# Patient Record
Sex: Female | Born: 1995 | Race: Black or African American | Hispanic: No | Marital: Single | Smoking: Never smoker
Health system: Southern US, Community
[De-identification: ages and names within clinical notes are randomized; demographics above are authoritative.]

## PROBLEM LIST (undated history)

## (undated) DIAGNOSIS — Z8719 Personal history of other diseases of the digestive system: Secondary | ICD-10-CM

## (undated) DIAGNOSIS — Z789 Other specified health status: Secondary | ICD-10-CM

## (undated) HISTORY — DX: Other specified health status: Z78.9

## (undated) HISTORY — PX: OTHER SURGICAL HISTORY: SHX169

---

## 2016-08-20 ENCOUNTER — Encounter (HOSPITAL_COMMUNITY): Payer: Self-pay | Admitting: Emergency Medicine

## 2016-08-20 ENCOUNTER — Ambulatory Visit (HOSPITAL_COMMUNITY)
Admission: EM | Admit: 2016-08-20 | Discharge: 2016-08-20 | Disposition: A | Discharge status: Home / Self Care | Payer: Managed Care, Other (non HMO) | Attending: Family Medicine | Admitting: Family Medicine

## 2016-08-20 DIAGNOSIS — J029 Acute pharyngitis, unspecified: Secondary | ICD-10-CM

## 2016-08-20 DIAGNOSIS — R05 Cough: Secondary | ICD-10-CM | POA: Diagnosis not present

## 2016-08-20 DIAGNOSIS — J0100 Acute maxillary sinusitis, unspecified: Secondary | ICD-10-CM

## 2016-08-20 DIAGNOSIS — R059 Cough, unspecified: Secondary | ICD-10-CM

## 2016-08-20 MED ORDER — AMOXICILLIN 875 MG PO TABS: 875.0000 mg | ORAL_TABLET | Freq: Two times a day (BID) | ORAL | 0 refills | Status: DC

## 2016-08-20 NOTE — ED Triage Notes (Signed)
Sorebthroat, cough, hot spells, stuffy head, stuffy nose

## 2016-08-20 NOTE — ED Provider Notes (Signed)
[facility]    CSN: [id number] Arrival date & time: 08/20/16  1609     History   Chief Complaint Chief Complaint  Patient presents with  . URI    HPI Tamara Pugh is a 21 y.o. female.   This is a 21 year old woman who presents to the [facility] urgent care center with sore throat, stuffy nose, and cough. Throat pain is gotten worse over the last 24 hours. She's had in the symptom complex for over a week  Patient denies productive phlegm or fever      History reviewed. No pertinent past medical history.  There are no active problems to display for this patient.   History reviewed. No pertinent surgical history.  OB History    No data available       Home Medications    Prior to Admission medications   Medication Sig Start Date End Date Taking? Authorizing Provider  Pseudoeph-Doxylamine-DM-APAP (NYQUIL PO) Take by mouth.   Yes Historical Provider, MD  amoxicillin (AMOXIL) 875 MG tablet Take 1 tablet (875 mg total) by mouth 2 (two) times daily. 08/20/16   Elvina Sidle, MD    Family History History reviewed. No pertinent family history.  Social History Social History  Substance Use Topics  . Smoking status: Never Smoker  . Smokeless tobacco: Never Used  . Alcohol use Yes     Allergies   Patient has no known allergies.   Review of Systems Review of Systems  Constitutional: Positive for diaphoresis.  HENT: Positive for congestion and sore throat.   Respiratory: Positive for cough.   Cardiovascular: Negative.   Gastrointestinal: Negative.   Genitourinary: Negative.   Neurological: Negative.      Physical Exam Triage Vital Signs ED Triage Vitals  Enc Vitals Group     BP      Pulse      Resp      Temp      Temp src      SpO2      Weight      Height      Head Circumference      Peak Flow      Pain Score      Pain Loc      Pain Edu?      Excl. in GC?    No data found.   Updated Vital Signs BP  118/73 (BP Location: Right Arm)   Pulse 92   Temp 98.4 F (36.9 C) (Oral)   Resp 18   LMP 08/13/2016   SpO2 100%    Physical Exam  Constitutional: She is oriented to person, place, and time. She appears well-developed and well-nourished.  HENT:  Right Ear: External ear normal.  Left Ear: External ear normal.  Mouth/Throat: Oropharynx is clear and moist.  Eyes: Conjunctivae and EOM are normal. Pupils are equal, round, and reactive to light.  Neck: Normal range of motion. Neck supple.  Cardiovascular: Normal rate, regular rhythm and normal heart sounds.   Pulmonary/Chest: Effort normal and breath sounds normal.  Musculoskeletal: Normal range of motion.  Neurological: She is alert and oriented to person, place, and time.  Skin: Skin is warm and dry.  Nursing note and vitals reviewed.    UC Treatments / Results  Labs (all labs ordered are listed, but only abnormal results are displayed) Labs Reviewed - No data to display  EKG  EKG Interpretation None       Radiology No results found.  Procedures Procedures (including critical care time)  Medications Ordered in UC Medications - No data to display   Initial Impression / Assessment and Plan / UC Course  I have reviewed the triage vital signs and the nursing notes.  Pertinent labs & imaging results that were available during my care of the patient were reviewed by me and considered in my medical decision making (see chart for details).     Final Clinical Impressions(s) / UC Diagnoses   Final diagnoses:  Acute maxillary sinusitis, recurrence not specified  Pharyngitis, unspecified etiology  Cough    New Prescriptions New Prescriptions   AMOXICILLIN (AMOXIL) 875 MG TABLET    Take 1 tablet (875 mg total) by mouth 2 (two) times daily.     Elvina SidleKurt Annajulia Lewing, MD 08/20/16 [phone]

## 2016-08-23 ENCOUNTER — Telehealth (HOSPITAL_COMMUNITY): Payer: Self-pay | Admitting: Emergency Medicine

## 2016-08-23 MED ORDER — AMOXICILLIN 400 MG/5ML PO SUSR: ORAL | 0 refills | Status: DC

## 2016-08-23 NOTE — Telephone Encounter (Signed)
The patient called and requested that the Amox pills be changed to liquid. Rip HarbourW. Oxford, FNP advised to change to Amox liquid 400mg /545ml and give 10 mls.

## 2017-04-29 ENCOUNTER — Emergency Department (HOSPITAL_COMMUNITY)
Admission: EM | Admit: 2017-04-29 | Discharge: 2017-04-29 | Disposition: A | Discharge status: Home / Self Care | Payer: BLUE CROSS/BLUE SHIELD | Attending: Emergency Medicine | Admitting: Emergency Medicine

## 2017-04-29 ENCOUNTER — Emergency Department (HOSPITAL_COMMUNITY): Payer: BLUE CROSS/BLUE SHIELD

## 2017-04-29 ENCOUNTER — Encounter (HOSPITAL_COMMUNITY): Payer: Self-pay | Admitting: Emergency Medicine

## 2017-04-29 DIAGNOSIS — R1031 Right lower quadrant pain: Secondary | ICD-10-CM | POA: Diagnosis present

## 2017-04-29 DIAGNOSIS — Z79899 Other long term (current) drug therapy: Secondary | ICD-10-CM | POA: Insufficient documentation

## 2017-04-29 DIAGNOSIS — R102 Pelvic and perineal pain: Secondary | ICD-10-CM

## 2017-04-29 LAB — URINALYSIS, ROUTINE W REFLEX MICROSCOPIC
Bilirubin Urine: NEGATIVE
Glucose, UA: NEGATIVE mg/dL
KETONES UR: NEGATIVE mg/dL
Nitrite: NEGATIVE
PROTEIN: NEGATIVE mg/dL
Specific Gravity, Urine: 1.028 (ref 1.005–1.030)
pH: 5 (ref 5.0–8.0)

## 2017-04-29 LAB — CBC
HCT: 37.4 % (ref 36.0–46.0)
Hemoglobin: 11.7 g/dL — ABNORMAL LOW (ref 12.0–15.0)
MCH: 24.5 pg — AB (ref 26.0–34.0)
MCHC: 31.3 g/dL (ref 30.0–36.0)
MCV: 78.2 fL (ref 78.0–100.0)
PLATELETS: 303 10*3/uL (ref 150–400)
RBC: 4.78 MIL/uL (ref 3.87–5.11)
RDW: 14.1 % (ref 11.5–15.5)
WBC: 10.9 10*3/uL — ABNORMAL HIGH (ref 4.0–10.5)

## 2017-04-29 LAB — BASIC METABOLIC PANEL
Anion gap: 12 (ref 5–15)
BUN: 12 mg/dL (ref 6–20)
CALCIUM: 9.5 mg/dL (ref 8.9–10.3)
CO2: 23 mmol/L (ref 22–32)
CREATININE: 0.83 mg/dL (ref 0.44–1.00)
Chloride: 101 mmol/L (ref 101–111)
GFR calc non Af Amer: >60 mL/min (ref >60)
GLUCOSE: 118 mg/dL — AB (ref 65–99)
Potassium: 4.1 mmol/L (ref 3.5–5.1)
Sodium: 136 mmol/L (ref 135–145)

## 2017-04-29 LAB — I-STAT TROPONIN, ED: TROPONIN I, POC: 0 ng/mL (ref 0.00–0.08)

## 2017-04-29 LAB — I-STAT BETA HCG BLOOD, ED (MC, WL, AP ONLY)

## 2017-04-29 LAB — LIPASE, BLOOD: Lipase: 36 U/L (ref 11–51)

## 2017-04-29 MED ORDER — ONDANSETRON HCL 4 MG/2ML IJ SOLN
4.0000 mg | Freq: Once | INTRAMUSCULAR | Status: AC
  Administered 2017-04-29: 4 mg via INTRAVENOUS
  Filled 2017-04-29: qty 2

## 2017-04-29 MED ORDER — SODIUM CHLORIDE 0.9 % IV SOLN
INTRAVENOUS | Status: DC
  Administered 2017-04-29: 12:00:00 via INTRAVENOUS

## 2017-04-29 MED ORDER — SODIUM CHLORIDE 0.9 % IV BOLUS (SEPSIS)
1000.0000 mL | Freq: Once | INTRAVENOUS | Status: AC
  Administered 2017-04-29: 1000 mL via INTRAVENOUS

## 2017-04-29 MED ORDER — NAPROXEN 500 MG PO TABS
500.0000 mg | ORAL_TABLET | Freq: Two times a day (BID) | ORAL | 0 refills | Status: DC
Start: 2017-04-29 — End: 2020-09-06

## 2017-04-29 MED ORDER — HYDROCODONE-ACETAMINOPHEN 5-325 MG PO TABS: 2.0000 | ORAL_TABLET | ORAL | 0 refills | Status: DC | PRN

## 2017-04-29 MED ORDER — ONDANSETRON 4 MG PO TBDP
4.0000 mg | ORAL_TABLET | Freq: Once | ORAL | Status: AC | PRN
  Administered 2017-04-29: 4 mg via ORAL

## 2017-04-29 MED ORDER — METHOCARBAMOL 750 MG PO TABS: 750.0000 mg | ORAL_TABLET | Freq: Four times a day (QID) | ORAL | 0 refills | Status: DC

## 2017-04-29 MED ORDER — MORPHINE SULFATE (PF) 4 MG/ML IV SOLN
4.0000 mg | Freq: Once | INTRAVENOUS | Status: AC
  Administered 2017-04-29: 4 mg via INTRAVENOUS
  Filled 2017-04-29: qty 1

## 2017-04-29 MED ORDER — ONDANSETRON 4 MG PO TBDP
ORAL_TABLET | ORAL | Status: AC
  Filled 2017-04-29: qty 1

## 2017-04-29 NOTE — ED Triage Notes (Signed)
Pt reports sudden onset abdominal pain that moved into her chest.  Pt reports that she is on antibiotics and ate "a little something to make sure I did not get sick."  She reports nausea and lightheadness, but denies fever/diaharria/SOB or loc

## 2017-04-29 NOTE — ED Notes (Signed)
Pt reports being on her menstrual cycle now stating that this pain in her abd. Is not like her normal cramps because it shoots into her chest.

## 2017-04-29 NOTE — ED Provider Notes (Signed)
[facility]   CSN: [id number] Arrival date & time: 04/29/17  0456     History   Chief Complaint Chief Complaint  Patient presents with  . Abdominal Pain  . Chest Pain    HPI Tamara Pugh is a 21 y.o. female.  21 y/o female here w/ several hours of rlq abd pain Pain did radiate to her chest but was not pleuritic or anginal No cough or dyspnea Pain is sharp and constant and worse with movement No associated urinary sx Pt has been on her period x 3 days and denies severe bleeding No fever but some emesis here in ED due to pain Sx better w/ remaining still and no tx used pta       History reviewed. No pertinent past medical history.  There are no active problems to display for this patient.   History reviewed. No pertinent surgical history.  OB History    No data available       Home Medications    Prior to Admission medications   Medication Sig Start Date End Date Taking? Authorizing Provider  amoxicillin (AMOXIL) 400 MG/5ML suspension Take 10 mLs (  total) by mouth 2 (two) times daily. 08/23/16   Deatra Canter, FNP  Pseudoeph-Doxylamine-DM-APAP (NYQUIL PO) Take by mouth.    [provider]    Family History No family history on file.  Social History Social History  Substance Use Topics  . Smoking status: Never Smoker  . Smokeless tobacco: Never Used  . Alcohol use Yes     Allergies   Patient has no known allergies.   Review of Systems Review of Systems  All other systems reviewed and are negative.    Physical Exam Updated Vital Signs BP 120/81 (BP Location: Right Arm)   Pulse 76   Temp 98.4 F (36.9 C) (Oral)   Resp 16   Ht 1.626 m ( )   Wt 61.2 kg (135 lb)   LMP 03/27/2017 (Exact Date)   SpO2 99%   BMI 23.17 kg/m   Physical Exam  Constitutional: She is oriented to person, place, and time. She appears well-developed and well-nourished.  Non-toxic appearance. No distress.  HENT:  Head:  Normocephalic and atraumatic.  Eyes: Pupils are equal, round, and reactive to light. Conjunctivae, EOM and lids are normal.  Neck: Normal range of motion. Neck supple. No tracheal deviation present. No thyroid mass present.  Cardiovascular: Normal rate, regular rhythm and normal heart sounds.  Exam reveals no gallop.   No murmur heard. Pulmonary/Chest: Effort normal and breath sounds normal. No stridor. No respiratory distress. She has no decreased breath sounds. She has no wheezes. She has no rhonchi. She has no rales.  Abdominal: Soft. Normal appearance and bowel sounds are normal. She exhibits no distension. There is tenderness in the right lower quadrant. There is no rigidity, no rebound, no guarding and no CVA tenderness.    Musculoskeletal: Normal range of motion. She exhibits no edema or tenderness.  Neurological: She is alert and oriented to person, place, and time. She has normal strength. No cranial nerve deficit or sensory deficit. GCS eye subscore is 4. GCS verbal subscore is 5. GCS motor subscore is 6.  Skin: Skin is warm and dry. No abrasion and no rash noted.  Psychiatric: She has a normal mood and affect. Her speech is normal and behavior is normal.  Nursing note and vitals reviewed.    ED Treatments / Results  Labs (all labs ordered are  listed, but only abnormal results are displayed) Labs Reviewed  BASIC METABOLIC PANEL - Abnormal; Notable for the following:       Result Value   Glucose, Bld 118 (*)    All other components within normal limits  CBC - Abnormal; Notable for the following:    WBC 10.9 (*)    Hemoglobin 11.7 (*)    MCH 24.5 (*)    All other components within normal limits  URINALYSIS, ROUTINE W REFLEX MICROSCOPIC - Abnormal; Notable for the following:    APPearance HAZY (*)    Hgb urine dipstick SMALL (*)    Leukocytes, UA TRACE (*)    Bacteria, UA RARE (*)    Squamous Epithelial / LPF 6-30 (*)    All other components within normal limits  LIPASE,  BLOOD  I-STAT TROPONIN, ED  POC URINE PREG, ED  I-STAT BETA HCG BLOOD, ED ([facility], [facility], [facility] ONLY)    EKG  EKG Interpretation  Date/Time:  Thursday April 29 2017 04:59:41 EDT Ventricular Rate:  92 PR Interval:  134 QRS Duration: 70 QT Interval:  342 QTC Calculation: 422 R Axis:   57 Text Interpretation:  Normal sinus rhythm Normal ECG Confirmed by Lorre Nick ([id number]) on 04/29/2017 9:40:56 AM       Radiology Dg Chest 2 View  Result Date: 04/29/2017 CLINICAL DATA:  Right upper quadrant abdominal pain radiating to the chest this morning. Vomiting. EXAM: CHEST  2 VIEW COMPARISON:  None. FINDINGS: The heart size and mediastinal contours are within normal limits. Both lungs are clear. The visualized skeletal structures are unremarkable. IMPRESSION: No active cardiopulmonary disease. Electronically Signed   By: Burman Nieves M.D.   On: 04/29/2017 05:57    Procedures Procedures (including critical care time)  Medications Ordered in ED Medications  ondansetron (ZOFRAN-ODT) 4 MG disintegrating tablet (not administered)  sodium chloride 0.9 % bolus 1,000 mL (not administered)  0.9 %  sodium chloride infusion (not administered)  morphine 4 MG/ML injection 4 mg (not administered)  ondansetron (ZOFRAN) injection 4 mg (not administered)  ondansetron (ZOFRAN-ODT) disintegrating tablet 4 mg (4 mg Oral Given 04/29/17 0526)     Initial Impression / Assessment and Plan / ED Course  I have reviewed the triage vital signs and the nursing notes.  Pertinent labs & imaging results that were available during my care of the patient were reviewed by me and considered in my medical decision making (see chart for details).     preg test neg Pelvic US neg for torsion or cyst Medicated for pain and feels better No concern for cardiac etiology Will rx analgesics  Final Clinical Impressions(s) / ED Diagnoses   Final diagnoses:  None    New Prescriptions New Prescriptions   No medications  on file     Lorre Nick, MD 04/29/17 1246

## 2017-04-29 NOTE — ED Notes (Signed)
Pt called. No answer

## 2017-04-29 NOTE — ED Notes (Signed)
Attempted IV in right AC, unable to obtain.  

## 2017-04-29 NOTE — ED Notes (Signed)
Pt transported to US

## 2017-04-30 ENCOUNTER — Emergency Department (HOSPITAL_COMMUNITY): Payer: BLUE CROSS/BLUE SHIELD

## 2017-04-30 ENCOUNTER — Inpatient Hospital Stay (HOSPITAL_COMMUNITY)
Admission: EM | Admit: 2017-04-30 | Discharge: 2017-05-05 | DRG: 390 | Disposition: A | Discharge status: Home / Self Care | Payer: BLUE CROSS/BLUE SHIELD | Attending: General Surgery | Admitting: General Surgery

## 2017-04-30 ENCOUNTER — Inpatient Hospital Stay (HOSPITAL_COMMUNITY): Payer: BLUE CROSS/BLUE SHIELD

## 2017-04-30 ENCOUNTER — Encounter (HOSPITAL_COMMUNITY): Payer: Self-pay | Admitting: Emergency Medicine

## 2017-04-30 DIAGNOSIS — R109 Unspecified abdominal pain: Secondary | ICD-10-CM | POA: Diagnosis present

## 2017-04-30 DIAGNOSIS — F419 Anxiety disorder, unspecified: Secondary | ICD-10-CM | POA: Diagnosis present

## 2017-04-30 DIAGNOSIS — K56600 Partial intestinal obstruction, unspecified as to cause: Principal | ICD-10-CM | POA: Diagnosis present

## 2017-04-30 DIAGNOSIS — E876 Hypokalemia: Secondary | ICD-10-CM | POA: Diagnosis present

## 2017-04-30 DIAGNOSIS — B9689 Other specified bacterial agents as the cause of diseases classified elsewhere: Secondary | ICD-10-CM | POA: Diagnosis present

## 2017-04-30 DIAGNOSIS — Z79899 Other long term (current) drug therapy: Secondary | ICD-10-CM | POA: Diagnosis not present

## 2017-04-30 DIAGNOSIS — Z0189 Encounter for other specified special examinations: Secondary | ICD-10-CM

## 2017-04-30 DIAGNOSIS — D649 Anemia, unspecified: Secondary | ICD-10-CM | POA: Diagnosis present

## 2017-04-30 DIAGNOSIS — K56609 Unspecified intestinal obstruction, unspecified as to partial versus complete obstruction: Secondary | ICD-10-CM | POA: Diagnosis present

## 2017-04-30 DIAGNOSIS — N760 Acute vaginitis: Secondary | ICD-10-CM | POA: Diagnosis present

## 2017-04-30 HISTORY — DX: Personal history of other diseases of the digestive system: Z87.19

## 2017-04-30 LAB — COMPREHENSIVE METABOLIC PANEL
ALBUMIN: 3.9 g/dL (ref 3.5–5.0)
ALK PHOS: 51 U/L (ref 38–126)
ALT: 15 U/L (ref 14–54)
ANION GAP: 12 (ref 5–15)
AST: 17 U/L (ref 15–41)
BUN: 13 mg/dL (ref 6–20)
CALCIUM: 8.7 mg/dL — AB (ref 8.9–10.3)
CO2: 23 mmol/L (ref 22–32)
CREATININE: 0.72 mg/dL (ref 0.44–1.00)
Chloride: 101 mmol/L (ref 101–111)
GFR calc Af Amer: >60 mL/min (ref >60)
GFR calc non Af Amer: >60 mL/min (ref >60)
GLUCOSE: 101 mg/dL — AB (ref 65–99)
Potassium: 3.6 mmol/L (ref 3.5–5.1)
SODIUM: 136 mmol/L (ref 135–145)
Total Bilirubin: 0.6 mg/dL (ref 0.3–1.2)
Total Protein: 7.1 g/dL (ref 6.5–8.1)

## 2017-04-30 LAB — CBC WITH DIFFERENTIAL/PLATELET
BASOS PCT: 0 %
Basophils Absolute: 0 10*3/uL (ref 0.0–0.1)
EOS ABS: 0 10*3/uL (ref 0.0–0.7)
EOS PCT: 0 %
HCT: 38.6 % (ref 36.0–46.0)
Hemoglobin: 12.6 g/dL (ref 12.0–15.0)
LYMPHS ABS: 1.9 10*3/uL (ref 0.7–4.0)
Lymphocytes Relative: 15 %
MCH: 25.4 pg — AB (ref 26.0–34.0)
MCHC: 32.6 g/dL (ref 30.0–36.0)
MCV: 77.7 fL — ABNORMAL LOW (ref 78.0–100.0)
MONOS PCT: 4 %
Monocytes Absolute: 0.6 10*3/uL (ref 0.1–1.0)
Neutro Abs: 10.2 10*3/uL — ABNORMAL HIGH (ref 1.7–7.7)
Neutrophils Relative %: 81 %
PLATELETS: 383 10*3/uL (ref 150–400)
RBC: 4.97 MIL/uL (ref 3.87–5.11)
RDW: 14 % (ref 11.5–15.5)
WBC: 12.7 10*3/uL — AB (ref 4.0–10.5)

## 2017-04-30 LAB — URINALYSIS, ROUTINE W REFLEX MICROSCOPIC
Bilirubin Urine: NEGATIVE
GLUCOSE, UA: NEGATIVE mg/dL
Hgb urine dipstick: NEGATIVE
Ketones, ur: 20 mg/dL — AB
NITRITE: NEGATIVE
PH: 5 (ref 5.0–8.0)
Protein, ur: 30 mg/dL — AB
SPECIFIC GRAVITY, URINE: 1.03 (ref 1.005–1.030)

## 2017-04-30 LAB — CBC
HCT: 36.1 % (ref 36.0–46.0)
HEMOGLOBIN: 11.8 g/dL — AB (ref 12.0–15.0)
MCH: 25.4 pg — AB (ref 26.0–34.0)
MCHC: 32.7 g/dL (ref 30.0–36.0)
MCV: 77.8 fL — ABNORMAL LOW (ref 78.0–100.0)
Platelets: 370 10*3/uL (ref 150–400)
RBC: 4.64 MIL/uL (ref 3.87–5.11)
RDW: 14.1 % (ref 11.5–15.5)
WBC: 12.5 10*3/uL — ABNORMAL HIGH (ref 4.0–10.5)

## 2017-04-30 LAB — LACTIC ACID, PLASMA
Lactic Acid, Venous: 1.2 mmol/L (ref 0.5–1.9)
Lactic Acid, Venous: 1.4 mmol/L (ref 0.5–1.9)

## 2017-04-30 LAB — POC URINE PREG, ED: Preg Test, Ur: NEGATIVE

## 2017-04-30 LAB — I-STAT BETA HCG BLOOD, ED (MC, WL, AP ONLY): I-stat hCG, quantitative: <5 m[IU]/mL (ref <5)

## 2017-04-30 MED ORDER — IOPAMIDOL (ISOVUE-300) INJECTION 61%
INTRAVENOUS | Status: AC
  Administered 2017-04-30: 100 mL via INTRAVENOUS
  Filled 2017-04-30: qty 100

## 2017-04-30 MED ORDER — METRONIDAZOLE IN NACL 5-0.79 MG/ML-% IV SOLN
500.0000 mg | Freq: Two times a day (BID) | INTRAVENOUS | Status: AC
  Administered 2017-04-30 – 2017-05-03 (×7): 500 mg via INTRAVENOUS
  Filled 2017-04-30 (×7): qty 100

## 2017-04-30 MED ORDER — SODIUM CHLORIDE 0.9 % IV BOLUS (SEPSIS)
1000.0000 mL | Freq: Once | INTRAVENOUS | Status: AC
  Administered 2017-04-30: 1000 mL via INTRAVENOUS

## 2017-04-30 MED ORDER — MORPHINE SULFATE (PF) 4 MG/ML IV SOLN
4.0000 mg | Freq: Once | INTRAVENOUS | Status: AC
  Administered 2017-04-30: 4 mg via INTRAVENOUS
  Filled 2017-04-30: qty 1

## 2017-04-30 MED ORDER — HYDRALAZINE HCL 20 MG/ML IJ SOLN: 10.0000 mg | Freq: Three times a day (TID) | INTRAMUSCULAR | Status: DC | PRN

## 2017-04-30 MED ORDER — ONDANSETRON HCL 4 MG/2ML IJ SOLN
4.0000 mg | Freq: Four times a day (QID) | INTRAMUSCULAR | Status: DC | PRN
  Administered 2017-05-01: 4 mg via INTRAVENOUS
  Filled 2017-04-30: qty 2

## 2017-04-30 MED ORDER — PHENYLEPHRINE HCL 10 % OP SOLN
Freq: Once | OPHTHALMIC | Status: AC
  Administered 2017-04-30: 17:00:00 via NASAL
  Filled 2017-04-30: qty 10

## 2017-04-30 MED ORDER — PROMETHAZINE HCL 25 MG/ML IJ SOLN
12.5000 mg | Freq: Once | INTRAMUSCULAR | Status: AC
  Administered 2017-04-30: 12.5 mg via INTRAVENOUS
  Filled 2017-04-30: qty 1

## 2017-04-30 MED ORDER — MORPHINE SULFATE (PF) 4 MG/ML IV SOLN
2.0000 mg | INTRAVENOUS | Status: DC | PRN
  Administered 2017-05-01 – 2017-05-02 (×5): 2 mg via INTRAVENOUS
  Filled 2017-04-30 (×6): qty 1

## 2017-04-30 MED ORDER — ONDANSETRON HCL 4 MG/2ML IJ SOLN
4.0000 mg | Freq: Once | INTRAMUSCULAR | Status: AC
  Administered 2017-04-30: 4 mg via INTRAVENOUS
  Filled 2017-04-30: qty 2

## 2017-04-30 MED ORDER — SODIUM CHLORIDE 0.9 % IV SOLN
INTRAVENOUS | Status: DC
  Administered 2017-04-30 – 2017-05-05 (×7): via INTRAVENOUS

## 2017-04-30 MED ORDER — SODIUM CHLORIDE 0.9 % IV BOLUS (SEPSIS): 1000.0000 mL | Freq: Once | INTRAVENOUS | Status: DC

## 2017-04-30 MED ORDER — HEPARIN SODIUM (PORCINE) 5000 UNIT/ML IJ SOLN
5000.0000 [IU] | Freq: Three times a day (TID) | INTRAMUSCULAR | Status: DC
  Filled 2017-04-30 (×2): qty 1

## 2017-04-30 MED ORDER — ACETAMINOPHEN 325 MG PO TABS
650.0000 mg | ORAL_TABLET | Freq: Four times a day (QID) | ORAL | Status: DC | PRN
  Administered 2017-04-30: 650 mg via ORAL
  Filled 2017-04-30: qty 2

## 2017-04-30 MED ORDER — LORAZEPAM 2 MG/ML IJ SOLN
0.5000 mg | Freq: Four times a day (QID) | INTRAMUSCULAR | Status: DC | PRN
  Administered 2017-04-30 – 2017-05-01 (×2): 0.5 mg via INTRAVENOUS
  Filled 2017-04-30 (×3): qty 1

## 2017-04-30 MED ORDER — ACETAMINOPHEN 650 MG RE SUPP: 650.0000 mg | Freq: Four times a day (QID) | RECTAL | Status: DC | PRN

## 2017-04-30 MED ORDER — SODIUM CHLORIDE 0.9% FLUSH
10.0000 mL | INTRAVENOUS | Status: DC | PRN
  Administered 2017-05-05: 10 mL
  Filled 2017-04-30: qty 40

## 2017-04-30 MED ORDER — MORPHINE SULFATE (PF) 4 MG/ML IV SOLN
2.0000 mg | Freq: Once | INTRAVENOUS | Status: AC
Start: 2017-04-30 — End: 2017-04-30
  Administered 2017-04-30: 2 mg via INTRAVENOUS

## 2017-04-30 MED ORDER — ONDANSETRON HCL 4 MG PO TABS: 4.0000 mg | ORAL_TABLET | Freq: Four times a day (QID) | ORAL | Status: DC | PRN

## 2017-04-30 MED ORDER — DIATRIZOATE MEGLUMINE & SODIUM 66-10 % PO SOLN
90.0000 mL | Freq: Once | ORAL | Status: AC
  Administered 2017-05-01: 90 mL via NASOGASTRIC
  Filled 2017-04-30 (×2): qty 90

## 2017-04-30 MED ORDER — LORAZEPAM 2 MG/ML IJ SOLN
0.5000 mg | Freq: Four times a day (QID) | INTRAMUSCULAR | Status: DC | PRN
  Administered 2017-04-30: 0.5 mg via INTRAVENOUS
  Filled 2017-04-30: qty 1

## 2017-04-30 NOTE — ED Notes (Signed)
Patient transported to CT 

## 2017-04-30 NOTE — ED Notes (Signed)
SURGICAL PA Provider at bedside. 

## 2017-04-30 NOTE — ED Notes (Signed)
Shawn Stall, primary RN request this writer to attempt [place] IV. One attempt made; unsuccessful. Pt does not tolerate arm straight with attempt and verbalizes "ouch." IV team consult placed.

## 2017-04-30 NOTE — Progress Notes (Signed)
Peripherally Inserted Central Catheter/Midline Placement  The IV Nurse has discussed with the patient and/or persons authorized to consent for the patient, the purpose of this procedure and the potential benefits and risks involved with this procedure.  The benefits include less needle sticks, lab draws from the catheter, and the patient may be discharged home with the catheter. Risks include, but not limited to, infection, bleeding, blood clot (thrombus formation), and puncture of an artery; nerve damage and irregular heartbeat and possibility to perform a PICC exchange if needed/ordered by physician.  Alternatives to this procedure were also discussed.  Bard Power PICC patient education guide, fact sheet on infection prevention and patient information card has been provided to patient /or left at bedside.    PICC/Midline Placement Documentation  PICC Double Lumen 04/30/17 PICC Left Basilic 43 cm 0 cm (Active)  Indication for Insertion or Continuance of Line Prolonged intravenous therapies;Limited venous access - need for IV therapy >5 days (PICC only) 04/30/2017  8:00 PM  Exposed Catheter (cm) 0 cm 04/30/2017  8:00 PM  Site Assessment Clean;Dry;Intact 04/30/2017  8:00 PM  Lumen #1 Status Flushed;Saline locked;Blood return noted 04/30/2017  8:00 PM  Lumen #2 Status Flushed;Saline locked;Blood return noted 04/30/2017  8:00 PM  Dressing Type Transparent;Securing device 04/30/2017  8:00 PM  Dressing Status Clean;Dry;Intact;Antimicrobial disc in place 04/30/2017  8:00 PM  Dressing Change Due 05/07/17 04/30/2017  8:00 PM       Tamara Pugh 04/30/2017, 8:32 PM

## 2017-04-30 NOTE — ED Notes (Addendum)
RN at bedside obtaining IV and labwork

## 2017-04-30 NOTE — ED Provider Notes (Signed)
[facility]   CSN: [id number] Arrival date & time: 04/30/17  4098     History   Chief Complaint Chief Complaint  Patient presents with  . Abdominal Pain    HPI Tamara Pugh is a 21 y.o. female.  21 year old female presents withpersistentright lower quadrant abdominal painwhich began yesterday. Was seen by myself after she presented with acute onset of severe right lower quadrant abdominal pain. Concern for ovarian torsion and patient had a negative Doppler ultrasound. At that time she had no fever. She is currently on her menstrual cycle. She had negative pregnancytest yesterday. States thatafter she left in during the evening she continued to have pain. Pain was sharp and persistent and with some associated emesis but no fever. Presents at this time for reevaluation.      History reviewed. No pertinent past medical history.  There are no active problems to display for this patient.   Past Surgical History:  Procedure Laterality Date  . feeding tube placement and removal      OB History    No data available       Home Medications    Prior to Admission medications   Medication Sig Start Date End Date Taking? Authorizing Provider  amoxicillin (AMOXIL) 400 MG/5ML suspension Take 10 mLs (  total) by mouth 2 (two) times daily. Patient not taking: Reported on 04/29/2017 08/23/16   Deatra Canter, FNP  azithromycin (ZITHROMAX) 500 MG tablet Take 1,000 mg by mouth once.    [provider]  HYDROcodone-acetaminophen (NORCO/VICODIN) 5-325 MG tablet Take 2 tablets by mouth every 4 (four) hours as needed. 04/29/17   Lorre Nick, MD  methocarbamol (ROBAXIN-750) 750 MG tablet Take 1 tablet (750 mg total) by mouth 4 (four) times daily. 04/29/17   Lorre Nick, MD  metroNIDAZOLE (FLAGYL) 500 MG tablet Take 500 mg by mouth 2 (two) times daily.    [provider]  naproxen (NAPROSYN) 500 MG tablet Take 1 tablet (500 mg total) by mouth 2  (two) times daily. 04/29/17   Lorre Nick, MD    Family History Family History  Problem Relation Age of Onset  . Family history unknown: Yes    Social History Social History  Substance Use Topics  . Smoking status: Never Smoker  . Smokeless tobacco: Never Used  . Alcohol use No     Allergies   Patient has no known allergies.   Review of Systems Review of Systems  All other systems reviewed and are negative.    Physical Exam Updated Vital Signs BP 137/89 (BP Location: Left Arm)   Pulse 86   Temp 98.2 F (36.8 C) (Oral)   Resp 18   LMP 04/24/2017 (Exact Date)   SpO2 98%   Physical Exam  Constitutional: She is oriented to person, place, and time. She appears well-developed and well-nourished.  Non-toxic appearance. No distress.  HENT:  Head: Normocephalic and atraumatic.  Eyes: Pupils are equal, round, and reactive to light. Conjunctivae, EOM and lids are normal.  Neck: Normal range of motion. Neck supple. No tracheal deviation present. No thyroid mass present.  Cardiovascular: Normal rate, regular rhythm and normal heart sounds.  Exam reveals no gallop.   No murmur heard. Pulmonary/Chest: Effort normal and breath sounds normal. No stridor. No respiratory distress. She has no decreased breath sounds. She has no wheezes. She has no rhonchi. She has no rales.  Abdominal: Soft. Normal appearance and bowel sounds are normal. She exhibits no distension. There is tenderness in  the right lower quadrant. There is guarding. There is no rigidity, no rebound and no CVA tenderness.    Musculoskeletal: Normal range of motion. She exhibits no edema or tenderness.  Neurological: She is alert and oriented to person, place, and time. She has normal strength. No cranial nerve deficit or sensory deficit. GCS eye subscore is 4. GCS verbal subscore is 5. GCS motor subscore is 6.  Skin: Skin is warm and dry. No abrasion and no rash noted.  Psychiatric: She has a normal mood and affect.  Her speech is normal and behavior is normal.  Nursing note and vitals reviewed.    ED Treatments / Results  Labs (all labs ordered are listed, but only abnormal results are displayed) Labs Reviewed  CBC WITH DIFFERENTIAL/PLATELET  URINALYSIS, ROUTINE W REFLEX MICROSCOPIC    EKG  EKG Interpretation None       Radiology Dg Chest 2 View  Result Date: 04/29/2017 CLINICAL DATA:  Right upper quadrant abdominal pain radiating to the chest this morning. Vomiting. EXAM: CHEST  2 VIEW COMPARISON:  None. FINDINGS: The heart size and mediastinal contours are within normal limits. Both lungs are clear. The visualized skeletal structures are unremarkable. IMPRESSION: No active cardiopulmonary disease. Electronically Signed   By: Burman Nieves M.D.   On: 04/29/2017 05:57   US Transvaginal Non-ob  Result Date: 04/29/2017 CLINICAL DATA:  21 year old female with pelvic pain. EXAM: TRANSABDOMINAL AND TRANSVAGINAL ULTRASOUND OF PELVIS DOPPLER ULTRASOUND OF OVARIES TECHNIQUE: Both transabdominal and transvaginal ultrasound examinations of the pelvis were performed. Transabdominal technique was performed for global imaging of the pelvis including uterus, ovaries, adnexal regions, and pelvic cul-de-sac. It was necessary to proceed with endovaginal exam following the transabdominal exam to visualize the ovaries. Color and duplex Doppler ultrasound was utilized to evaluate blood flow to the ovaries. COMPARISON:  None. FINDINGS: Uterus Measurements: 7.2 x 4.7 x 5.5 cm. Retroverted. No fibroids or other mass visualized. Endometrium Thickness: 3 mm.  No focal abnormality visualized. Right ovary Measurements: 3.8 x 2.2 x 2.0 cm. Multiple small follicles (image 73). Normal appearance/no adnexal mass. Left ovary Measurements: 4.1 x 1.9 x 1.7 cm. Multiple small follicles (image 85). Normal appearance/no adnexal mass. Pulsed Doppler evaluation of both ovaries demonstrates normal low-resistance arterial and venous  waveforms. Other findings Trace free fluid near the left ovary. IMPRESSION: Negative for ovarian torsion. Uterus and both ovaries appear within normal limits. Electronically Signed   By: Odessa Fleming M.D.   On: 04/29/2017 12:36   US Pelvis Complete  Result Date: 04/29/2017 CLINICAL DATA:  21 year old female with pelvic pain. EXAM: TRANSABDOMINAL AND TRANSVAGINAL ULTRASOUND OF PELVIS DOPPLER ULTRASOUND OF OVARIES TECHNIQUE: Both transabdominal and transvaginal ultrasound examinations of the pelvis were performed. Transabdominal technique was performed for global imaging of the pelvis including uterus, ovaries, adnexal regions, and pelvic cul-de-sac. It was necessary to proceed with endovaginal exam following the transabdominal exam to visualize the ovaries. Color and duplex Doppler ultrasound was utilized to evaluate blood flow to the ovaries. COMPARISON:  None. FINDINGS: Uterus Measurements: 7.2 x 4.7 x 5.5 cm. Retroverted. No fibroids or other mass visualized. Endometrium Thickness: 3 mm.  No focal abnormality visualized. Right ovary Measurements: 3.8 x 2.2 x 2.0 cm. Multiple small follicles (image 73). Normal appearance/no adnexal mass. Left ovary Measurements: 4.1 x 1.9 x 1.7 cm. Multiple small follicles (image 85). Normal appearance/no adnexal mass. Pulsed Doppler evaluation of both ovaries demonstrates normal low-resistance arterial and venous waveforms. Other findings Trace free fluid near the left ovary.  IMPRESSION: Negative for ovarian torsion. Uterus and both ovaries appear within normal limits. Electronically Signed   By: Odessa Fleming M.D.   On: 04/29/2017 12:36   [place] Art/ven Flow Abd Pelv Doppler  Result Date: 04/29/2017 CLINICAL DATA:  21 year old female with pelvic pain. EXAM: TRANSABDOMINAL AND TRANSVAGINAL ULTRASOUND OF PELVIS DOPPLER ULTRASOUND OF OVARIES TECHNIQUE: Both transabdominal and transvaginal ultrasound examinations of the pelvis were performed. Transabdominal technique was performed for  global imaging of the pelvis including uterus, ovaries, adnexal regions, and pelvic cul-de-sac. It was necessary to proceed with endovaginal exam following the transabdominal exam to visualize the ovaries. Color and duplex Doppler ultrasound was utilized to evaluate blood flow to the ovaries. COMPARISON:  None. FINDINGS: Uterus Measurements: 7.2 x 4.7 x 5.5 cm. Retroverted. No fibroids or other mass visualized. Endometrium Thickness: 3 mm.  No focal abnormality visualized. Right ovary Measurements: 3.8 x 2.2 x 2.0 cm. Multiple small follicles (image 73). Normal appearance/no adnexal mass. Left ovary Measurements: 4.1 x 1.9 x 1.7 cm. Multiple small follicles (image 85). Normal appearance/no adnexal mass. Pulsed Doppler evaluation of both ovaries demonstrates normal low-resistance arterial and venous waveforms. Other findings Trace free fluid near the left ovary. IMPRESSION: Negative for ovarian torsion. Uterus and both ovaries appear within normal limits. Electronically Signed   By: Odessa Fleming M.D.   On: 04/29/2017 12:36    Procedures Procedures (including critical care time)  Medications Ordered in ED Medications  0.9 %  sodium chloride infusion (not administered)  sodium chloride 0.9 % bolus 1,000 mL (not administered)  morphine 4 MG/ML injection 4 mg (not administered)  ondansetron (ZOFRAN) injection 4 mg (not administered)     Initial Impression / Assessment and Plan / ED Course  I have reviewed the triage vital signs and the nursing notes.  Pertinent labs & imaging results that were available during my care of the patient were reviewed by me and considered in my medical decision making (see chart for details).     Patient medicated with IV fluids as well as opiate medication. Abdominal CTperformed due to concern for appendicitis shows a partial small bowel obstruction.patient does have a prior history of a feeding tube in place when she was a babyand does have a surgical scar from that.  Suspect that the etiology is adhesions from her prior surgery. We'll admit to the hospitalist service  Final Clinical Impressions(s) / ED Diagnoses   Final diagnoses:  None    New Prescriptions New Prescriptions   No medications on file     Lorre Nick, MD 04/30/17 1135

## 2017-04-30 NOTE — ED Notes (Addendum)
PENDING NGT PLACEMENT, RN DAVID AWARE. PT AWARE OF NEED AND IS WILLING TO ATTEMPT PLACEMENT ONCE UP STAIRS. THIS RN HAS NOT WITNESSED ANY VOMITING AND PT HAS NOT REPORTED ANY VOMITING

## 2017-04-30 NOTE — ED Notes (Signed)
PT WILLING TO ATTEMPT NGT PLACEMENT AGAIN. RECEIVING RN DAVID AWARE. REPORT GIVEN

## 2017-04-30 NOTE — Progress Notes (Signed)
Patient unable to tolerate placement of ng tube in ed.  Order placed for lidocain/phenylephrine, to ease the insertion.  Small amounts of solution used at first, with attempts made unsuccessful due to discomfort.  NG able to advanced past nares, but patient held water in mouth, and stated that she could not swallow, due to numbness.  Patient then spit water out of mouth, and pulled ng tube out.  General surgery on-call made aware.

## 2017-04-30 NOTE — ED Notes (Signed)
CT ASSISTED WITH ATTEMPT FOR IV ACCESS. NOT SUCCESSFUL.

## 2017-04-30 NOTE — ED Notes (Signed)
IV TEAM PLACED IV AND BLOOD DRAWN

## 2017-04-30 NOTE — H&P (Signed)
[facility] History and Physical  Imaan Padgett ZOX:[id number] DOB: 1996-01-15 DOA: 04/30/2017  Referring physician:  PCP: Patient, No Pcp Per   Chief Complaint: "Everything I try to drink comes back up."  HPI: Tamara Pugh is a 21 y.o. female  with past medical history of feeding tube placement and removal presents to the hospital with nausea vomiting abdominal pain. Of note patient was recently seen in emergency room for abdominal pain and worked up for torsion was ruled out and sent home with a muscle relaxer. Also noteworthy is patient is currently being treated for bacterial vaginosis and was recently also treated for chlamydia. Patient states after leaving the emergency room yesterday her abdominal pain continued. She did not improve. She continued to have trouble eating and drinking which has been going on for at least the last 48 hours.  No family history of small bowel obstructive.  ED course: CT abdomen and pelvis done that showed partial small bowel obstruction. Hospitalist consulted for admission.   Review of Systems:  As per HPI otherwise 10 point review of systems negative.    History reviewed. No pertinent past medical history. Past Surgical History:  Procedure Laterality Date  . feeding tube placement and removal     Social History:  reports that she has never smoked. She has never used smokeless tobacco. She reports that she does not drink alcohol or use drugs.  No Known Allergies  Family History  Problem Relation Age of Onset  . Family history unknown: Yes     Prior to Admission medications   Medication Sig Start Date End Date Taking? Authorizing Provider  HYDROcodone-acetaminophen (NORCO/VICODIN) 5-325 MG tablet Take 2 tablets by mouth every 4 (four) hours as needed. 04/29/17  Yes Lorre Nick, MD  methocarbamol (ROBAXIN-750) 750 MG tablet Take 1 tablet (750 mg total) by mouth 4 (four) times daily. 04/29/17  Yes Lorre Nick, MD  metroNIDAZOLE (FLAGYL)  500 MG tablet Take 500 mg by mouth 2 (two) times daily.   Yes [provider]  naproxen (NAPROSYN) 500 MG tablet Take 1 tablet (500 mg total) by mouth 2 (two) times daily. 04/29/17  Yes Lorre Nick, MD  amoxicillin (AMOXIL) 400 MG/5ML suspension Take 10 mLs (  total) by mouth 2 (two) times daily. Patient not taking: Reported on 04/29/2017 08/23/16   Deatra Canter, FNP  azithromycin (ZITHROMAX) 500 MG tablet Take 1,000 mg by mouth once.    [provider]   Physical Exam: Vitals:   04/30/17 8119 04/30/17 0758 04/30/17 1156 04/30/17 1230  BP: 137/89  116/71 112/78  Pulse: 86  79   Resp: 18  18   Temp: 98.2 F (36.8 C)  98 F (36.7 C)   TempSrc: Oral  Oral   SpO2: 98%  100%   Weight:  61.2 kg (135 lb)    Height:   (1.626 m)      Wt Readings from Last 3 Encounters:  04/30/17 61.2 kg (135 lb)  04/29/17 61.2 kg (135 lb)    General:  Appears calm and comfortable; A&Ox3, GCS 15 Eyes:  PERRL, EOMI, normal lids, iris ENT:  grossly normal hearing, lips & tongue Neck:  no LAD, masses or thyromegaly Cardiovascular:  RRR, no m/r/g. No LE edema.  Respiratory:  CTA bilaterally, no w/r/r. Normal respiratory effort. Abdomen:  Tense, distended, tender to palpation diffusely but worse in lower quadrants, no rebound Skin:  no rash or induration seen on limited exam Musculoskeletal:  grossly normal tone BUE/BLE Psychiatric:  grossly normal mood and affect, speech fluent and appropriate Neurologic:  CN 2-12 grossly intact, moves all extremities in coordinated fashion.          Labs on Admission:  Basic Metabolic Panel:  Recent Labs Lab 04/29/17 0525  NA 136  K 4.1  CL 101  CO2 23  GLUCOSE 118*  BUN 12  CREATININE 0.83  CALCIUM 9.5   Liver Function Tests: No results for input(s): AST, ALT, ALKPHOS, BILITOT, PROT, ALBUMIN in the last 168 hours.  Recent Labs Lab 04/29/17 0525  LIPASE 36   No results for input(s): AMMONIA in the last 168  hours. CBC:  Recent Labs Lab 04/29/17 0525 04/30/17 0942 04/30/17 1236  WBC 10.9* 12.7* 12.5*  NEUTROABS  --  10.2*  --   HGB 11.7* 12.6 11.8*  HCT 37.4 38.6 36.1  MCV 78.2 77.7* 77.8*  PLT 303 383 370   Cardiac Enzymes: No results for input(s): CKTOTAL, CKMB, CKMBINDEX, TROPONINI in the last 168 hours.  BNP (last 3 results) No results for input(s): BNP in the last 8760 hours.  ProBNP (last 3 results) No results for input(s): PROBNP in the last 8760 hours.   Serum creatinine: 0.83 mg/dL 16/10/96 0454 Estimated creatinine clearance: 91 mL/min  CBG: No results for input(s): GLUCAP in the last 168 hours.  Radiological Exams on Admission: Dg Chest 2 View  Result Date: 04/29/2017 CLINICAL DATA:  Right upper quadrant abdominal pain radiating to the chest this morning. Vomiting. EXAM: CHEST  2 VIEW COMPARISON:  None. FINDINGS: The heart size and mediastinal contours are within normal limits. Both lungs are clear. The visualized skeletal structures are unremarkable. IMPRESSION: No active cardiopulmonary disease. Electronically Signed   By: Burman Nieves M.D.   On: 04/29/2017 05:57   US Transvaginal Non-ob  Result Date: 04/29/2017 CLINICAL DATA:  21 year old female with pelvic pain. EXAM: TRANSABDOMINAL AND TRANSVAGINAL ULTRASOUND OF PELVIS DOPPLER ULTRASOUND OF OVARIES TECHNIQUE: Both transabdominal and transvaginal ultrasound examinations of the pelvis were performed. Transabdominal technique was performed for global imaging of the pelvis including uterus, ovaries, adnexal regions, and pelvic cul-de-sac. It was necessary to proceed with endovaginal exam following the transabdominal exam to visualize the ovaries. Color and duplex Doppler ultrasound was utilized to evaluate blood flow to the ovaries. COMPARISON:  None. FINDINGS: Uterus Measurements: 7.2 x 4.7 x 5.5 cm. Retroverted. No fibroids or other mass visualized. Endometrium Thickness: 3 mm.  No focal abnormality visualized.  Right ovary Measurements: 3.8 x 2.2 x 2.0 cm. Multiple small follicles (image 73). Normal appearance/no adnexal mass. Left ovary Measurements: 4.1 x 1.9 x 1.7 cm. Multiple small follicles (image 85). Normal appearance/no adnexal mass. Pulsed Doppler evaluation of both ovaries demonstrates normal low-resistance arterial and venous waveforms. Other findings Trace free fluid near the left ovary. IMPRESSION: Negative for ovarian torsion. Uterus and both ovaries appear within normal limits. Electronically Signed   By: Odessa Fleming M.D.   On: 04/29/2017 12:36   US Pelvis Complete  Result Date: 04/29/2017 CLINICAL DATA:  21 year old female with pelvic pain. EXAM: TRANSABDOMINAL AND TRANSVAGINAL ULTRASOUND OF PELVIS DOPPLER ULTRASOUND OF OVARIES TECHNIQUE: Both transabdominal and transvaginal ultrasound examinations of the pelvis were performed. Transabdominal technique was performed for global imaging of the pelvis including uterus, ovaries, adnexal regions, and pelvic cul-de-sac. It was necessary to proceed with endovaginal exam following the transabdominal exam to visualize the ovaries. Color and duplex Doppler ultrasound was utilized to evaluate blood flow to the ovaries. COMPARISON:  None. FINDINGS: Uterus Measurements: 7.2 x  4.7 x 5.5 cm. Retroverted. No fibroids or other mass visualized. Endometrium Thickness: 3 mm.  No focal abnormality visualized. Right ovary Measurements: 3.8 x 2.2 x 2.0 cm. Multiple small follicles (image 73). Normal appearance/no adnexal mass. Left ovary Measurements: 4.1 x 1.9 x 1.7 cm. Multiple small follicles (image 85). Normal appearance/no adnexal mass. Pulsed Doppler evaluation of both ovaries demonstrates normal low-resistance arterial and venous waveforms. Other findings Trace free fluid near the left ovary. IMPRESSION: Negative for ovarian torsion. Uterus and both ovaries appear within normal limits. Electronically Signed   By: Odessa Fleming M.D.   On: 04/29/2017 12:36   Ct Abdomen Pelvis W  Contrast  Result Date: 04/30/2017 CLINICAL DATA:  Abdominal and pelvic pain for several hours EXAM: CT ABDOMEN AND PELVIS WITH CONTRAST TECHNIQUE: Multidetector CT imaging of the abdomen and pelvis was performed using the standard protocol following bolus administration of intravenous contrast. CONTRAST:  ISOVUE-300 IOPAMIDOL (ISOVUE-300) INJECTION 61% COMPARISON:  None. FINDINGS: Lower chest: No acute abnormality. Hepatobiliary: No focal liver abnormality is seen. No gallstones, gallbladder wall thickening, or biliary dilatation. Pancreas: Unremarkable. No pancreatic ductal dilatation or surrounding inflammatory changes. Spleen: Normal in size without focal abnormality. Adrenals/Urinary Tract: The adrenal glands are within normal limits. The kidneys demonstrate a normal enhancement pattern bilaterally. The bladder is partially distended. Stomach/Bowel: The appendix is within normal limits. The colon is predominately decompressed. Multiple dilated loops of small bowel are identified within the mid abdomen. Some fecalization of bowel contents is noted within the small bowel. The transition point appears to be in the mid to distal abdomen just to the right of the midline best seen on image number 47 of series 2 and image number 36 of series 602. No definitive mass lesion is noted to correspond with the transition point. The more distal small bowel appears within normal limits. Vascular/Lymphatic: No significant vascular findings are present. No enlarged abdominal or pelvic lymph nodes. Reproductive: Uterus and bilateral adnexa are unremarkable. Other: No abdominal wall hernia or abnormality. No abdominopelvic ascites. Musculoskeletal: No acute or significant osseous findings. IMPRESSION: Multiple dilated loops of small bowel with fecalization of small bowel contents consistent with at least a partial small bowel obstruction. A transition point is noted in the mid abdomen to the right of the midline although no  mass lesion is noted at the transition point. Continued follow-up exam is recommended. No other focal abnormality is seen. Electronically Signed   By: Alcide Clever M.D.   On: 04/30/2017 11:06   [place] Art/ven Flow Abd Pelv Doppler  Result Date: 04/29/2017 CLINICAL DATA:  22 year old female with pelvic pain. EXAM: TRANSABDOMINAL AND TRANSVAGINAL ULTRASOUND OF PELVIS DOPPLER ULTRASOUND OF OVARIES TECHNIQUE: Both transabdominal and transvaginal ultrasound examinations of the pelvis were performed. Transabdominal technique was performed for global imaging of the pelvis including uterus, ovaries, adnexal regions, and pelvic cul-de-sac. It was necessary to proceed with endovaginal exam following the transabdominal exam to visualize the ovaries. Color and duplex Doppler ultrasound was utilized to evaluate blood flow to the ovaries. COMPARISON:  None. FINDINGS: Uterus Measurements: 7.2 x 4.7 x 5.5 cm. Retroverted. No fibroids or other mass visualized. Endometrium Thickness: 3 mm.  No focal abnormality visualized. Right ovary Measurements: 3.8 x 2.2 x 2.0 cm. Multiple small follicles (image 73). Normal appearance/no adnexal mass. Left ovary Measurements: 4.1 x 1.9 x 1.7 cm. Multiple small follicles (image 85). Normal appearance/no adnexal mass. Pulsed Doppler evaluation of both ovaries demonstrates normal low-resistance arterial and venous waveforms. Other findings Trace free  fluid near the left ovary. IMPRESSION: Negative for ovarian torsion. Uterus and both ovaries appear within normal limits. Electronically Signed   By: Odessa Fleming M.D.   On: 04/29/2017 12:36    EKG: Independently reviewed. Reviewed EKG from 10/4. Poor tracing but no sign of ST elevation or depression.  Assessment/Plan Principal Problem:   SBO (small bowel obstruction) (HCC) Active Problems:   BV (bacterial vaginosis)   Small bowel obstruction [added to hx] Patient's first occurrence of this issue with distant history of abdominal surgery.  Possibly use of muscle relaxer which slows the bowel could've been a trigger worsening pains or this could've been brewing for days. NG tube to be ordered General surgery consult Nothing by mouth IV fluids CMP from today lactic acid pending PICC line placed due to difficulty getting access and lab work drawn Prn zofran for sleep Prn ativan for anxiety and sleep  Bacterial vaginosis Continue therapy with Flagyl IV   Code Status: Full Code DVT Prophylaxis: Heparin, SCDs Family Communication: MOP is on way to hospital to speak in person Disposition Plan: Pending Improvement  Status: Med surg, inpt  Haydee Salter, MD Family Medicine [facility] [email or web address] Password [email or web address]

## 2017-04-30 NOTE — Consult Note (Signed)
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 female.   HPI: Pt seen in the ED yesterday for pain in the RLQ, and there was concern for ovarian torsion.  This was ruled out with ultrasound and she was discharged home.  She returns today with ongoing pain, nausea and vomiting. Pain is still in the RLQ.   Work up today shows she is afebrile, VSS.  WBC is up to 12.7, yesterday it was 10.9.  BMP yesterday was normal.  Urine shows negative nitrates, and very few wBC.  Negative HCG x 2.  CMP and lactate are currently pending.   CT scan shows a normal appendix, colon is decompressed but multiple loops of SB dilatation and fecalization of some of the small bowel.  Transition point is in the mid distal abdomen.  She does have a hx of feeding tube placement as an infant.  She is not aware of any family with inflammatory bowel disease.  She has never had this before, but she has had short episodes of RLQ pain that were transient.   History reviewed. No pertinent past medical history.  Past Surgical History:  Procedure Laterality Date  . feeding tube placement and removal      Family History  Problem Relation Age of Onset  . Family history unknown: Yes    Social History:  reports that she has never smoked. She has never used smokeless tobacco. She reports that she does not drink alcohol or use drugs.  Allergies: No Known Allergies Prior to Admission medications   Medication Sig Start Date End Date Taking? Authorizing Provider  HYDROcodone-acetaminophen (NORCO/VICODIN) 5-325 MG tablet Take 2 tablets by mouth every 4 (four) hours as needed. 04/29/17  Yes Lorre Nick, MD  methocarbamol (ROBAXIN-750) 750 MG tablet Take 1 tablet (750 mg total) by mouth 4 (four) times daily. 04/29/17  Yes Lorre Nick, MD  metroNIDAZOLE (FLAGYL) 500 MG tablet Take 500 mg by mouth 2 (two) times  daily.   Yes [provider]  naproxen (NAPROSYN) 500 MG tablet Take 1 tablet (500 mg total) by mouth 2 (two) times daily. 04/29/17  Yes Lorre Nick, MD  amoxicillin (AMOXIL) 400 MG/5ML suspension Take 10 mLs (  total) by mouth 2 (two) times daily. Patient not taking: Reported on 04/29/2017 08/23/16   Deatra Canter, FNP  azithromycin (ZITHROMAX) 500 MG tablet Take 1,000 mg by mouth once.    [provider]     Results for orders placed or performed during the hospital encounter of 04/30/17 (from the past 48 hour(s))  CBC with Differential/Platelet     Status: Abnormal   Collection Time: 04/30/17  9:42 AM  Result Value Ref Range   WBC 12.7 (H) 4.0 - 10.5 K/uL   RBC 4.97 3.87 - 5.11 MIL/uL   Hemoglobin 12.6 12.0 - 15.0 g/dL   HCT 16.1 09.6 - 04.5 %   MCV 77.7 (L) 78.0 - 100.0 fL   MCH 25.4 (L) 26.0 - 34.0 pg   MCHC 32.6 30.0 - 36.0 g/dL   RDW 40.9 81.1 - 91.4 %   Platelets 383 150 - 400 K/uL   Neutrophils Relative % 81 %   Neutro Abs 10.2 (H) 1.7 - 7.7 K/uL   Lymphocytes Relative 15 %   Lymphs Abs 1.9 0.7 - 4.0 K/uL   Monocytes Relative 4 %   Monocytes  Absolute 0.6 0.1 - 1.0 K/uL   Eosinophils Relative 0 %   Eosinophils Absolute 0.0 0.0 - 0.7 K/uL   Basophils Relative 0 %   Basophils Absolute 0.0 0.0 - 0.1 K/uL  Urinalysis, Routine w reflex microscopic     Status: Abnormal   Collection Time: 04/30/17  9:42 AM  Result Value Ref Range   Color, Urine YELLOW YELLOW   APPearance HAZY (A) CLEAR   Specific Gravity, Urine 1.030 1.005 - 1.030   pH 5.0 5.0 - 8.0   Glucose, UA NEGATIVE NEGATIVE mg/dL   Hgb urine dipstick NEGATIVE NEGATIVE   Bilirubin Urine NEGATIVE NEGATIVE   Ketones, ur 20 (A) NEGATIVE mg/dL   Protein, ur 30 (A) NEGATIVE mg/dL   Nitrite NEGATIVE NEGATIVE   Leukocytes, UA TRACE (A) NEGATIVE   RBC / HPF 0-5 0 - 5 RBC/hpf   WBC, UA 0-5 0 - 5 WBC/hpf   Bacteria, UA FEW (A) NONE SEEN   Squamous Epithelial / LPF 6-30 (A) NONE SEEN   Mucus  PRESENT   I-Stat Beta hCG blood, ED ([facility], [facility], [facility] only)     Status: None   Collection Time: 04/30/17  9:56 AM  Result Value Ref Range   I-stat hCG, quantitative <5.0 <5 mIU/mL   Comment 3            Comment:   GEST. AGE      CONC.  (mIU/mL)   <=1 WEEK        5 - 50     2 WEEKS       50 - 500     3 WEEKS       100 - 10,000     4 WEEKS     1,000 - 30,000        FEMALE AND NON-PREGNANT FEMALE:     LESS THAN 5 mIU/mL     Dg Chest 2 View  Result Date: 04/29/2017 CLINICAL DATA:  Right upper quadrant abdominal pain radiating to the chest this morning. Vomiting. EXAM: CHEST  2 VIEW COMPARISON:  None. FINDINGS: The heart size and mediastinal contours are within normal limits. Both lungs are clear. The visualized skeletal structures are unremarkable. IMPRESSION: No active cardiopulmonary disease. Electronically Signed   By: Burman Nieves M.D.   On: 04/29/2017 05:57   US Transvaginal Non-ob  Result Date: 04/29/2017 CLINICAL DATA:  21 year old female with pelvic pain. EXAM: TRANSABDOMINAL AND TRANSVAGINAL ULTRASOUND OF PELVIS DOPPLER ULTRASOUND OF OVARIES TECHNIQUE: Both transabdominal and transvaginal ultrasound examinations of the pelvis were performed. Transabdominal technique was performed for global imaging of the pelvis including uterus, ovaries, adnexal regions, and pelvic cul-de-sac. It was necessary to proceed with endovaginal exam following the transabdominal exam to visualize the ovaries. Color and duplex Doppler ultrasound was utilized to evaluate blood flow to the ovaries. COMPARISON:  None. FINDINGS: Uterus Measurements: 7.2 x 4.7 x 5.5 cm. Retroverted. No fibroids or other mass visualized. Endometrium Thickness: 3 mm.  No focal abnormality visualized. Right ovary Measurements: 3.8 x 2.2 x 2.0 cm. Multiple small follicles (image 73). Normal appearance/no adnexal mass. Left ovary Measurements: 4.1 x 1.9 x 1.7 cm. Multiple small follicles (image 85). Normal appearance/no adnexal mass. Pulsed  Doppler evaluation of both ovaries demonstrates normal low-resistance arterial and venous waveforms. Other findings Trace free fluid near the left ovary. IMPRESSION: Negative for ovarian torsion. Uterus and both ovaries appear within normal limits. Electronically Signed   By: Odessa Fleming M.D.   On: 04/29/2017 12:36  US Pelvis Complete  Result Date: 04/29/2017 CLINICAL DATA:  21 year old female with pelvic pain. EXAM: TRANSABDOMINAL AND TRANSVAGINAL ULTRASOUND OF PELVIS DOPPLER ULTRASOUND OF OVARIES TECHNIQUE: Both transabdominal and transvaginal ultrasound examinations of the pelvis were performed. Transabdominal technique was performed for global imaging of the pelvis including uterus, ovaries, adnexal regions, and pelvic cul-de-sac. It was necessary to proceed with endovaginal exam following the transabdominal exam to visualize the ovaries. Color and duplex Doppler ultrasound was utilized to evaluate blood flow to the ovaries. COMPARISON:  None. FINDINGS: Uterus Measurements: 7.2 x 4.7 x 5.5 cm. Retroverted. No fibroids or other mass visualized. Endometrium Thickness: 3 mm.  No focal abnormality visualized. Right ovary Measurements: 3.8 x 2.2 x 2.0 cm. Multiple small follicles (image 73). Normal appearance/no adnexal mass. Left ovary Measurements: 4.1 x 1.9 x 1.7 cm. Multiple small follicles (image 85). Normal appearance/no adnexal mass. Pulsed Doppler evaluation of both ovaries demonstrates normal low-resistance arterial and venous waveforms. Other findings Trace free fluid near the left ovary. IMPRESSION: Negative for ovarian torsion. Uterus and both ovaries appear within normal limits. Electronically Signed   By: Odessa Fleming M.D.   On: 04/29/2017 12:36   Ct Abdomen Pelvis W Contrast  Result Date: 04/30/2017 CLINICAL DATA:  Abdominal and pelvic pain for several hours EXAM: CT ABDOMEN AND PELVIS WITH CONTRAST TECHNIQUE: Multidetector CT imaging of the abdomen and pelvis was performed using the standard protocol  following bolus administration of intravenous contrast. CONTRAST:  ISOVUE-300 IOPAMIDOL (ISOVUE-300) INJECTION 61% COMPARISON:  None. FINDINGS: Lower chest: No acute abnormality. Hepatobiliary: No focal liver abnormality is seen. No gallstones, gallbladder wall thickening, or biliary dilatation. Pancreas: Unremarkable. No pancreatic ductal dilatation or surrounding inflammatory changes. Spleen: Normal in size without focal abnormality. Adrenals/Urinary Tract: The adrenal glands are within normal limits. The kidneys demonstrate a normal enhancement pattern bilaterally. The bladder is partially distended. Stomach/Bowel: The appendix is within normal limits. The colon is predominately decompressed. Multiple dilated loops of small bowel are identified within the mid abdomen. Some fecalization of bowel contents is noted within the small bowel. The transition point appears to be in the mid to distal abdomen just to the right of the midline best seen on image number 47 of series 2 and image number 36 of series 602. No definitive mass lesion is noted to correspond with the transition point. The more distal small bowel appears within normal limits. Vascular/Lymphatic: No significant vascular findings are present. No enlarged abdominal or pelvic lymph nodes. Reproductive: Uterus and bilateral adnexa are unremarkable. Other: No abdominal wall hernia or abnormality. No abdominopelvic ascites. Musculoskeletal: No acute or significant osseous findings. IMPRESSION: Multiple dilated loops of small bowel with fecalization of small bowel contents consistent with at least a partial small bowel obstruction. A transition point is noted in the mid abdomen to the right of the midline although no mass lesion is noted at the transition point. Continued follow-up exam is recommended. No other focal abnormality is seen. Electronically Signed   By: Alcide Clever M.D.   On: 04/30/2017 11:06   [place] Art/ven Flow Abd Pelv Doppler  Result  Date: 04/29/2017 CLINICAL DATA:  21 year old female with pelvic pain. EXAM: TRANSABDOMINAL AND TRANSVAGINAL ULTRASOUND OF PELVIS DOPPLER ULTRASOUND OF OVARIES TECHNIQUE: Both transabdominal and transvaginal ultrasound examinations of the pelvis were performed. Transabdominal technique was performed for global imaging of the pelvis including uterus, ovaries, adnexal regions, and pelvic cul-de-sac. It was necessary to proceed with endovaginal exam following the transabdominal exam to visualize the ovaries. Color  and duplex Doppler ultrasound was utilized to evaluate blood flow to the ovaries. COMPARISON:  None. FINDINGS: Uterus Measurements: 7.2 x 4.7 x 5.5 cm. Retroverted. No fibroids or other mass visualized. Endometrium Thickness: 3 mm.  No focal abnormality visualized. Right ovary Measurements: 3.8 x 2.2 x 2.0 cm. Multiple small follicles (image 73). Normal appearance/no adnexal mass. Left ovary Measurements: 4.1 x 1.9 x 1.7 cm. Multiple small follicles (image 85). Normal appearance/no adnexal mass. Pulsed Doppler evaluation of both ovaries demonstrates normal low-resistance arterial and venous waveforms. Other findings Trace free fluid near the left ovary. IMPRESSION: Negative for ovarian torsion. Uterus and both ovaries appear within normal limits. Electronically Signed   By: Odessa Fleming M.D.   On: 04/29/2017 12:36    Review of Systems  Constitutional: Negative for chills, fever, malaise/fatigue and weight loss.  HENT: Negative.   Eyes: Negative.   Respiratory: Negative.   Cardiovascular: Negative.   Gastrointestinal: Positive for abdominal pain, nausea and vomiting. Negative for blood in stool, constipation, diarrhea, heartburn and melena.       Last BM yesterday  Genitourinary: Negative.   Musculoskeletal: Negative.   Skin: Negative.   Neurological: Negative.  Negative for weakness.  Endo/Heme/Allergies: Negative.   Psychiatric/Behavioral: Negative.    Blood pressure 116/71, pulse 79, temperature  98 F (36.7 C), temperature source Oral, resp. rate 18, height  (1.626 m), weight 61.2 kg (135 lb), last menstrual period 04/24/2017, SpO2 100 %. Physical Exam  Constitutional: She is oriented to person, place, and time. She appears well-developed and well-nourished. No distress.  HENT:  Head: Normocephalic and atraumatic.  Mouth/Throat: No oropharyngeal exudate.  Eyes: Pupils are equal, round, and reactive to light. Right eye exhibits no discharge. Left eye exhibits no discharge. No scleral icterus.  Pupils are equal  Neck: Normal range of motion. Neck supple. No JVD present. No tracheal deviation present. No thyromegaly present.  Cardiovascular: Normal rate, regular rhythm, normal heart sounds and intact distal pulses.   No murmur heard. Respiratory: Effort normal and breath sounds normal. No respiratory distress. She has no wheezes. She has no rales. She exhibits no tenderness.  GI: Soft. Bowel sounds are normal. She exhibits no distension (some distension) and no mass. There is tenderness (mild tenderness lower abdomne, I cannot see that right is much different than the left.). There is no rebound and no guarding.  Horizontal Scar 5 cm long, RLQ  Musculoskeletal: She exhibits no edema or tenderness.  Lymphadenopathy:    She has no cervical adenopathy.  Neurological: She is alert and oriented to person, place, and time. A cranial nerve deficit is present.  Skin: Skin is warm and dry. No rash noted. She is not diaphoretic. No erythema.  Psychiatric: She has a normal mood and affect. Her behavior is normal. Judgment and thought content normal.    Assessment/Plan: SBO Prior abdominal surgery with RLQ scar - pediatric feeding tube   Plan:  Agree with admit, NPO, NG for decompression, will try Small bowel protocol later if NG tube goes in and is successful.    Tamara Pugh 04/30/2017, 11:57 AM

## 2017-04-30 NOTE — ED Notes (Signed)
ATTEMPT X 1 WITH [place] FOR ACCESS. NOT SUCCESSFUL. REQUESTING ASSISTANCE WITH IV ACCESS.

## 2017-04-30 NOTE — ED Triage Notes (Signed)
Pt states she is having sharp abd pain that started Thursday night at 2 am  Pt states she has had nausea, vomiting, and diarrhea  Pt states she was seen at another hospital but they did not do much for her  Pt states she is unable to keep the medication down because when she takes it she throws it back up

## 2017-04-30 NOTE — ED Notes (Signed)
REQUESTING MORGAN R RN TO ATTEMPT IV WITH BLOOD DRAW

## 2017-04-30 NOTE — ED Notes (Addendum)
ED TO INPATIENT HANDOFF REPORT  Name/Age/Gender Tamara Pugh 21 y.o. female  Code Status    Code Status Orders        Start     Ordered   04/30/17 1159  Full code  Continuous     04/30/17 1200    Code Status History    Date Active Date Inactive Code Status Order ID Comments User Context   This patient has a current code status but no historical code status.      Home/SNF/Other Home  Chief Complaint Severe Abd Pain  Level of Care/Admitting Diagnosis ED Disposition    ED Disposition Condition Comment   Admit  Hospital Area: [place] [[id number]]  Level of Care: Med-Surg [16]  Diagnosis: SBO (small bowel obstruction) [facility]) [[id number]]  Admitting Physician: Elwin Mocha [[id number]]  Attending Physician: Elwin Mocha [[id number]]  Estimated length of stay: past midnight tomorrow  Certification:: I certify this patient will need inpatient services for at least 2 midnights  PT Class (Do Not Modify): Inpatient [101]  PT Acc Code (Do Not Modify): Private [1]       Medical History Past Medical History:  Diagnosis Date  . Hx SBO     Allergies No Known Allergies  IV Location/Drains/Wounds Patient Lines/Drains/Airways Status   Active Line/Drains/Airways    Name:   Placement date:   Placement time:   Site:   Days:   Peripheral IV 04/30/17 Left Antecubital  04/30/17    0943    Antecubital    less than 1          Labs/Imaging Results for orders placed or performed during the hospital encounter of 04/30/17 (from the past 48 hour(s))  CBC with Differential/Platelet     Status: Abnormal   Collection Time: 04/30/17  9:42 AM  Result Value Ref Range   WBC 12.7 (H) 4.0 - 10.5 K/uL   RBC 4.97 3.87 - 5.11 MIL/uL   Hemoglobin 12.6 12.0 - 15.0 g/dL   HCT 38.6 36.0 - 46.0 %   MCV 77.7 (L) 78.0 - 100.0 fL   MCH 25.4 (L) 26.0 - 34.0 pg   MCHC 32.6 30.0 - 36.0 g/dL   RDW 14.0 11.5 - 15.5 %   Platelets 383 150 - 400 K/uL   Neutrophils Relative % 81 %    Neutro Abs 10.2 (H) 1.7 - 7.7 K/uL   Lymphocytes Relative 15 %   Lymphs Abs 1.9 0.7 - 4.0 K/uL   Monocytes Relative 4 %   Monocytes Absolute 0.6 0.1 - 1.0 K/uL   Eosinophils Relative 0 %   Eosinophils Absolute 0.0 0.0 - 0.7 K/uL   Basophils Relative 0 %   Basophils Absolute 0.0 0.0 - 0.1 K/uL  Urinalysis, Routine w reflex microscopic     Status: Abnormal   Collection Time: 04/30/17  9:42 AM  Result Value Ref Range   Color, Urine YELLOW YELLOW   APPearance HAZY (A) CLEAR   Specific Gravity, Urine 1.030 1.005 - 1.030   pH 5.0 5.0 - 8.0   Glucose, UA NEGATIVE NEGATIVE mg/dL   Hgb urine dipstick NEGATIVE NEGATIVE   Bilirubin Urine NEGATIVE NEGATIVE   Ketones, ur 20 (A) NEGATIVE mg/dL   Protein, ur 30 (A) NEGATIVE mg/dL   Nitrite NEGATIVE NEGATIVE   Leukocytes, UA TRACE (A) NEGATIVE   RBC / HPF 0-5 0 - 5 RBC/hpf   WBC, UA 0-5 0 - 5 WBC/hpf   Bacteria, UA FEW (A) NONE SEEN   Squamous  Epithelial / LPF 6-30 (A) NONE SEEN   Mucus PRESENT   I-Stat Beta hCG blood, ED ([facility], [facility], [facility] only)     Status: None   Collection Time: 04/30/17  9:56 AM  Result Value Ref Range   I-stat hCG, quantitative <5.0 <5 mIU/mL   Comment 3            Comment:   GEST. AGE      CONC.  (mIU/mL)   <=1 WEEK        5 - 50     2 WEEKS       50 - 500     3 WEEKS       100 - 10,000     4 WEEKS     1,000 - 30,000        FEMALE AND NON-PREGNANT FEMALE:     LESS THAN 5 mIU/mL   Lactic acid, plasma     Status: None   Collection Time: 04/30/17 12:30 PM  Result Value Ref Range   Lactic Acid, Venous 1.2 0.5 - 1.9 mmol/L  Comprehensive metabolic panel     Status: Abnormal   Collection Time: 04/30/17 12:36 PM  Result Value Ref Range   Sodium 136 135 - 145 mmol/L   Potassium 3.6 3.5 - 5.1 mmol/L   Chloride 101 101 - 111 mmol/L   CO2 23 22 - 32 mmol/L   Glucose, Bld 101 (H) 65 - 99 mg/dL   BUN 13 6 - 20 mg/dL   Creatinine, Ser 0.72 0.44 - 1.00 mg/dL   Calcium 8.7 (L) 8.9 - 10.3 mg/dL   Total Protein 7.1 6.5 - 8.1 g/dL    Albumin 3.9 3.5 - 5.0 g/dL   AST 17 15 - 41 U/L   ALT 15 14 - 54 U/L   Alkaline Phosphatase 51 38 - 126 U/L   Total Bilirubin 0.6 0.3 - 1.2 mg/dL   GFR calc non Af Amer >60 >60 mL/min   GFR calc Af Amer >60 >60 mL/min    Comment: (NOTE) The eGFR has been calculated using the CKD EPI equation. This calculation has not been validated in all clinical situations. eGFR's persistently <60 mL/min signify possible Chronic Kidney Disease.    Anion gap 12 5 - 15  CBC     Status: Abnormal   Collection Time: 04/30/17 12:36 PM  Result Value Ref Range   WBC 12.5 (H) 4.0 - 10.5 K/uL   RBC 4.64 3.87 - 5.11 MIL/uL   Hemoglobin 11.8 (L) 12.0 - 15.0 g/dL   HCT 36.1 36.0 - 46.0 %   MCV 77.8 (L) 78.0 - 100.0 fL   MCH 25.4 (L) 26.0 - 34.0 pg   MCHC 32.7 30.0 - 36.0 g/dL   RDW 14.1 11.5 - 15.5 %   Platelets 370 150 - 400 K/uL   Dg Chest 2 View  Result Date: 04/29/2017 CLINICAL DATA:  Right upper quadrant abdominal pain radiating to the chest this morning. Vomiting. EXAM: CHEST  2 VIEW COMPARISON:  None. FINDINGS: The heart size and mediastinal contours are within normal limits. Both lungs are clear. The visualized skeletal structures are unremarkable. IMPRESSION: No active cardiopulmonary disease. Electronically Signed   By: Lucienne Capers M.D.   On: 04/29/2017 05:57   US Transvaginal Non-ob  Result Date: 04/29/2017 CLINICAL DATA:  21 year old female with pelvic pain. EXAM: TRANSABDOMINAL AND TRANSVAGINAL ULTRASOUND OF PELVIS DOPPLER ULTRASOUND OF OVARIES TECHNIQUE: Both transabdominal and transvaginal ultrasound examinations of the pelvis were performed. Transabdominal  technique was performed for global imaging of the pelvis including uterus, ovaries, adnexal regions, and pelvic cul-de-sac. It was necessary to proceed with endovaginal exam following the transabdominal exam to visualize the ovaries. Color and duplex Doppler ultrasound was utilized to evaluate blood flow to the ovaries. COMPARISON:   None. FINDINGS: Uterus Measurements: 7.2 x 4.7 x 5.5 cm. Retroverted. No fibroids or other mass visualized. Endometrium Thickness: 3 mm.  No focal abnormality visualized. Right ovary Measurements: 3.8 x 2.2 x 2.0 cm. Multiple small follicles (image 73). Normal appearance/no adnexal mass. Left ovary Measurements: 4.1 x 1.9 x 1.7 cm. Multiple small follicles (image 85). Normal appearance/no adnexal mass. Pulsed Doppler evaluation of both ovaries demonstrates normal low-resistance arterial and venous waveforms. Other findings Trace free fluid near the left ovary. IMPRESSION: Negative for ovarian torsion. Uterus and both ovaries appear within normal limits. Electronically Signed   By: Genevie Ann M.D.   On: 04/29/2017 12:36   US Pelvis Complete  Result Date: 04/29/2017 CLINICAL DATA:  21 year old female with pelvic pain. EXAM: TRANSABDOMINAL AND TRANSVAGINAL ULTRASOUND OF PELVIS DOPPLER ULTRASOUND OF OVARIES TECHNIQUE: Both transabdominal and transvaginal ultrasound examinations of the pelvis were performed. Transabdominal technique was performed for global imaging of the pelvis including uterus, ovaries, adnexal regions, and pelvic cul-de-sac. It was necessary to proceed with endovaginal exam following the transabdominal exam to visualize the ovaries. Color and duplex Doppler ultrasound was utilized to evaluate blood flow to the ovaries. COMPARISON:  None. FINDINGS: Uterus Measurements: 7.2 x 4.7 x 5.5 cm. Retroverted. No fibroids or other mass visualized. Endometrium Thickness: 3 mm.  No focal abnormality visualized. Right ovary Measurements: 3.8 x 2.2 x 2.0 cm. Multiple small follicles (image 73). Normal appearance/no adnexal mass. Left ovary Measurements: 4.1 x 1.9 x 1.7 cm. Multiple small follicles (image 85). Normal appearance/no adnexal mass. Pulsed Doppler evaluation of both ovaries demonstrates normal low-resistance arterial and venous waveforms. Other findings Trace free fluid near the left ovary. IMPRESSION:  Negative for ovarian torsion. Uterus and both ovaries appear within normal limits. Electronically Signed   By: Genevie Ann M.D.   On: 04/29/2017 12:36   Ct Abdomen Pelvis W Contrast  Result Date: 04/30/2017 CLINICAL DATA:  Abdominal and pelvic pain for several hours EXAM: CT ABDOMEN AND PELVIS WITH CONTRAST TECHNIQUE: Multidetector CT imaging of the abdomen and pelvis was performed using the standard protocol following bolus administration of intravenous contrast. CONTRAST:  170m ISOVUE-300 IOPAMIDOL (ISOVUE-300) INJECTION 61% COMPARISON:  None. FINDINGS: Lower chest: No acute abnormality. Hepatobiliary: No focal liver abnormality is seen. No gallstones, gallbladder wall thickening, or biliary dilatation. Pancreas: Unremarkable. No pancreatic ductal dilatation or surrounding inflammatory changes. Spleen: Normal in size without focal abnormality. Adrenals/Urinary Tract: The adrenal glands are within normal limits. The kidneys demonstrate a normal enhancement pattern bilaterally. The bladder is partially distended. Stomach/Bowel: The appendix is within normal limits. The colon is predominately decompressed. Multiple dilated loops of small bowel are identified within the mid abdomen. Some fecalization of bowel contents is noted within the small bowel. The transition point appears to be in the mid to distal abdomen just to the right of the midline best seen on image number 47 of series 2 and image number 36 of series 602. No definitive mass lesion is noted to correspond with the transition point. The more distal small bowel appears within normal limits. Vascular/Lymphatic: No significant vascular findings are present. No enlarged abdominal or pelvic lymph nodes. Reproductive: Uterus and bilateral adnexa are unremarkable. Other: No abdominal wall hernia  or abnormality. No abdominopelvic ascites. Musculoskeletal: No acute or significant osseous findings. IMPRESSION: Multiple dilated loops of small bowel with fecalization  of small bowel contents consistent with at least a partial small bowel obstruction. A transition point is noted in the mid abdomen to the right of the midline although no mass lesion is noted at the transition point. Continued follow-up exam is recommended. No other focal abnormality is seen. Electronically Signed   By: Inez Catalina M.D.   On: 04/30/2017 11:06   [place] Art/ven Flow Abd Pelv Doppler  Result Date: 04/29/2017 CLINICAL DATA:  21 year old female with pelvic pain. EXAM: TRANSABDOMINAL AND TRANSVAGINAL ULTRASOUND OF PELVIS DOPPLER ULTRASOUND OF OVARIES TECHNIQUE: Both transabdominal and transvaginal ultrasound examinations of the pelvis were performed. Transabdominal technique was performed for global imaging of the pelvis including uterus, ovaries, adnexal regions, and pelvic cul-de-sac. It was necessary to proceed with endovaginal exam following the transabdominal exam to visualize the ovaries. Color and duplex Doppler ultrasound was utilized to evaluate blood flow to the ovaries. COMPARISON:  None. FINDINGS: Uterus Measurements: 7.2 x 4.7 x 5.5 cm. Retroverted. No fibroids or other mass visualized. Endometrium Thickness: 3 mm.  No focal abnormality visualized. Right ovary Measurements: 3.8 x 2.2 x 2.0 cm. Multiple small follicles (image 73). Normal appearance/no adnexal mass. Left ovary Measurements: 4.1 x 1.9 x 1.7 cm. Multiple small follicles (image 85). Normal appearance/no adnexal mass. Pulsed Doppler evaluation of both ovaries demonstrates normal low-resistance arterial and venous waveforms. Other findings Trace free fluid near the left ovary. IMPRESSION: Negative for ovarian torsion. Uterus and both ovaries appear within normal limits. Electronically Signed   By: Genevie Ann M.D.   On: 04/29/2017 12:36    Pending Labs Unresulted Labs    Start     Ordered   05/01/17 6720  Basic metabolic panel  Tomorrow morning,   R     04/30/17 1200   05/01/17 0500  CBC  Tomorrow morning,   R     04/30/17  1200   04/30/17 1158  HIV antibody (Routine Testing)  Once,   R     04/30/17 1200   04/30/17 1155  Lactic acid, plasma  STAT Now then every 3 hours,   R     04/30/17 1154      Vitals/Pain Today's Vitals   04/30/17 1230 04/30/17 1252 04/30/17 1358 04/30/17 1522  BP: 112/78  129/72   Pulse:   93   Resp:   12   Temp:   98.6 F (37 C)   TempSrc:   Oral   SpO2:   99%   Weight:      Height:      PainSc:  3   0-No pain    Isolation Precautions No active isolations  Medications Medications  0.9 %  sodium chloride infusion ( Intravenous New Bag/Given 04/30/17 1004)  metroNIDAZOLE (FLAGYL) IVPB 500 mg (500 mg Intravenous New Bag/Given 04/30/17 1520)  hydrALAZINE (APRESOLINE) injection 10 mg (not administered)  heparin injection 5,000 Units (not administered)  acetaminophen (TYLENOL) tablet 650 mg (not administered)    Or  acetaminophen (TYLENOL) suppository 650 mg (not administered)  ondansetron (ZOFRAN) tablet 4 mg (not administered)    Or  ondansetron (ZOFRAN) injection 4 mg (not administered)  LORazepam (ATIVAN) injection 0.5 mg (0.5 mg Intravenous Given 04/30/17 1346)  diatrizoate meglumine-sodium (GASTROGRAFIN) 66-10 % solution 90 mL (not administered)  sodium chloride 0.9 % bolus 1,000 mL (0 mLs Intravenous Stopped 04/30/17 1115)  morphine 4 MG/ML injection 4  mg (4 mg Intravenous Given 04/30/17 0953)  ondansetron (ZOFRAN) injection 4 mg (4 mg Intravenous Given 04/30/17 0952)  iopamidol (ISOVUE-300) 61 % injection (100 mLs Intravenous Contrast Given 04/30/17 1038)    Mobility Walks

## 2017-04-30 NOTE — ED Notes (Addendum)
ATTEMPT X 2 TO PLACE NGT. NOT SUCCESSFUL.(attempts only made to mouth. No trauma. No resistance. Pt unable to relax and withdrew tube).  PT ANXIOUS. WILL GIVEN MED AND REASSESS

## 2017-05-01 ENCOUNTER — Inpatient Hospital Stay (HOSPITAL_COMMUNITY): Payer: BLUE CROSS/BLUE SHIELD

## 2017-05-01 DIAGNOSIS — K56609 Unspecified intestinal obstruction, unspecified as to partial versus complete obstruction: Secondary | ICD-10-CM

## 2017-05-01 LAB — CBC
HCT: 30.4 % — ABNORMAL LOW (ref 36.0–46.0)
HEMOGLOBIN: 9.9 g/dL — AB (ref 12.0–15.0)
MCH: 25.6 pg — ABNORMAL LOW (ref 26.0–34.0)
MCHC: 32.6 g/dL (ref 30.0–36.0)
MCV: 78.8 fL (ref 78.0–100.0)
PLATELETS: 319 10*3/uL (ref 150–400)
RBC: 3.86 MIL/uL — AB (ref 3.87–5.11)
RDW: 14.2 % (ref 11.5–15.5)
WBC: 8.5 10*3/uL (ref 4.0–10.5)

## 2017-05-01 LAB — BASIC METABOLIC PANEL
ANION GAP: 5 (ref 5–15)
BUN: 16 mg/dL (ref 6–20)
CALCIUM: 8.4 mg/dL — AB (ref 8.9–10.3)
CO2: 26 mmol/L (ref 22–32)
CREATININE: 0.7 mg/dL (ref 0.44–1.00)
Chloride: 108 mmol/L (ref 101–111)
Glucose, Bld: 90 mg/dL (ref 65–99)
Potassium: 3.7 mmol/L (ref 3.5–5.1)
SODIUM: 139 mmol/L (ref 135–145)

## 2017-05-01 LAB — HIV ANTIBODY (ROUTINE TESTING W REFLEX): HIV SCREEN 4TH GENERATION: NONREACTIVE

## 2017-05-01 NOTE — Progress Notes (Signed)
PROGRESS NOTE  Tamara Pugh ZOX:[id number] DOB: 1995-12-03 DOA: 04/30/2017 PCP: Patient, No Pcp Per  HPI/Recap of past 24 hours:  Patient was not able to tolerate ng placement, she is currently npo, continue c/o ab pain, no n/v, last bm /faltus yesterday  Assessment/Plan: Principal Problem:   SBO (small bowel obstruction) (HCC) Active Problems:   BV (bacterial vaginosis)   Small bowel obstruction -Patient's first occurrence of this issue with distant history of abdominal surgery. Possibly use of muscle relaxer which slows the bowel could've been a trigger worsening pains or this could've been brewing for days. -She did not tolerate NG placement. she is npo, on ivf, prn anagesics -General surgery consult, she is started on small bowel protocol, she got the contrast by mouth on 10/6.   Poor IV access:  PICC line placed due to difficulty getting access and lab work drawn  Bacterial vaginosis Continue therapy with Flagyl IV  Anemia: mcv 77-78 hgb 11.8- 9.9 No acute sign of bleeding, tbili 0.6. Anemia work up with retic, tsh, b12, folate, iron.   Code Status: Full Code DVT Prophylaxis: Heparin (she refused), SCDs Family Communication: patient Disposition Plan: Pending Improvement    Consultants:  general surgery  Procedures:  Failed attempt of ng placement  Antibiotics:  Iv flagyl   Objective: BP 126/75 (BP Location: Right Arm)   Pulse 87   Temp 98.2 F (36.8 C) (Oral)   Resp 16   Ht  (1.626 m)   Wt 61.2 kg (135 lb)   LMP 04/24/2017 (Exact Date) Comment: negative HCG blood test 04-30-2017  SpO2 100%   BMI 23.17 kg/m   Intake/Output Summary (Last 24 hours) at 05/01/17 1040 Last data filed at 05/01/17 0738  Gross per 24 hour  Intake          4091.67 ml  Output                0 ml  Net          4091.67 ml   Filed Weights   04/30/17 0758  Weight: 61.2 kg (135 lb)    Exam: Patient is examined daily including today on 05/01/2017, exams remain  the same as of yesterday except that has changed    General:  NAD  Cardiovascular: RRR  Respiratory: CTABL  Abdomen: slight tender, mild distention, overall still soft, positive BS (all four quadrant, slightly decreased left lower quadrant  Musculoskeletal: No Edema  Neuro: alert, oriented   Data Reviewed: Basic Metabolic Panel:  Recent Labs Lab 04/29/17 0525 04/30/17 1236 05/01/17 0426  NA 136 136 139  K 4.1 3.6 3.7  CL 101 101 108  CO2 GLUCOSE 118* 101* 90  BUN CREATININE 0.83 0.72 0.70  CALCIUM 9.5 8.7* 8.4*   Liver Function Tests:  Recent Labs Lab 04/30/17 1236  AST 17  ALT 15  ALKPHOS 51  BILITOT 0.6  PROT 7.1  ALBUMIN 3.9    Recent Labs Lab 04/29/17 0525  LIPASE 36   No results for input(s): AMMONIA in the last 168 hours. CBC:  Recent Labs Lab 04/29/17 0525 04/30/17 0942 04/30/17 1236 05/01/17 0426  WBC 10.9* 12.7* 12.5* 8.5  NEUTROABS  --  10.2*  --   --   HGB 11.7* 12.6 11.8* 9.9*  HCT 37.4 38.6 36.1 30.4*  MCV 78.2 77.7* 77.8* 78.8  PLT 303 383 370 319   Cardiac Enzymes:   No results for input(s): CKTOTAL, CKMB, CKMBINDEX,  TROPONINI in the last 168 hours. BNP (last 3 results) No results for input(s): BNP in the last 8760 hours.  ProBNP (last 3 results) No results for input(s): PROBNP in the last 8760 hours.  CBG: No results for input(s): GLUCAP in the last 168 hours.  No results found for this or any previous visit (from the past 240 hour(s)).   Studies: Ct Abdomen Pelvis W Contrast  Result Date: 04/30/2017 CLINICAL DATA:  Abdominal and pelvic pain for several hours EXAM: CT ABDOMEN AND PELVIS WITH CONTRAST TECHNIQUE: Multidetector CT imaging of the abdomen and pelvis was performed using the standard protocol following bolus administration of intravenous contrast. CONTRAST:  ISOVUE-300 IOPAMIDOL (ISOVUE-300) INJECTION 61% COMPARISON:  None. FINDINGS: Lower chest: No acute abnormality. Hepatobiliary:  No focal liver abnormality is seen. No gallstones, gallbladder wall thickening, or biliary dilatation. Pancreas: Unremarkable. No pancreatic ductal dilatation or surrounding inflammatory changes. Spleen: Normal in size without focal abnormality. Adrenals/Urinary Tract: The adrenal glands are within normal limits. The kidneys demonstrate a normal enhancement pattern bilaterally. The bladder is partially distended. Stomach/Bowel: The appendix is within normal limits. The colon is predominately decompressed. Multiple dilated loops of small bowel are identified within the mid abdomen. Some fecalization of bowel contents is noted within the small bowel. The transition point appears to be in the mid to distal abdomen just to the right of the midline best seen on image number 47 of series 2 and image number 36 of series 602. No definitive mass lesion is noted to correspond with the transition point. The more distal small bowel appears within normal limits. Vascular/Lymphatic: No significant vascular findings are present. No enlarged abdominal or pelvic lymph nodes. Reproductive: Uterus and bilateral adnexa are unremarkable. Other: No abdominal wall hernia or abnormality. No abdominopelvic ascites. Musculoskeletal: No acute or significant osseous findings. IMPRESSION: Multiple dilated loops of small bowel with fecalization of small bowel contents consistent with at least a partial small bowel obstruction. A transition point is noted in the mid abdomen to the right of the midline although no mass lesion is noted at the transition point. Continued follow-up exam is recommended. No other focal abnormality is seen. Electronically Signed   By: Alcide Clever M.D.   On: 04/30/2017 11:06    Scheduled Meds: . diatrizoate meglumine-sodium  90 mL Per NG tube Once  . heparin  5,000 Units Subcutaneous Q8H    Continuous Infusions: . sodium chloride 125 mL/hr at 04/30/17 2132  . metronidazole Stopped (05/01/17 0232)     Time  spent: 25 mins I have personally reviewed and interpreted on  05/01/2017 daily labs,imagings as discussed above under date review session and assessment and plans.  I reviewed all nursing notes, pharmacy notes, consultant notes,  vitals, pertinent old records  I have discussed plan of care as described above with RN , patient on 05/01/2017   Eesha Schmaltz MD, PhD  [facility] Pager [phone]. If 7PM-7AM, please contact night-coverage at [email or web address], password [facility] 05/01/2017, 10:40 AM  LOS: 1 day

## 2017-05-01 NOTE — Progress Notes (Signed)
Subjective/Chief Complaint: NG unable to be placed yesterday. Patient denies nausea or emesis, still bloated, no flatus or BM   Objective: Vital signs in last 24 hours: Temp:  [98 F (36.7 C)-99 F (37.2 C)] 98.2 F (36.8 C) (10/06 0533) Pulse Rate:  [79-93] 87 (10/06 0533) Resp:  [12-18] 16 (10/06 0533) BP: (112-130)/(61-78) 126/75 (10/06 0533) SpO2:  [99 %-100 %] 100 % (10/06 0533) Last BM Date: 04/29/17  Intake/Output from previous day: 10/05 0701 - 10/06 0700 In: 4091.7 [I.V.:2991.7; IV Piggyback:1100] Out: -  Intake/Output this shift: No intake/output data recorded.  General appearance: alert and cooperative Resp: clear to auscultation bilaterally GI: soft, mildly distended, mildly diffusely tender Skin: Skin color, texture, turgor normal. No rashes or lesions  Lab Results:   Recent Labs  04/30/17 1236 05/01/17 0426  WBC 12.5* 8.5  HGB 11.8* 9.9*  HCT 36.1 30.4*  PLT 370 319   BMET  Recent Labs  04/30/17 1236 05/01/17 0426  NA 136 139  K 3.6 3.7  CL 101 108  CO2 23 26  GLUCOSE 101* 90  BUN 13 16  CREATININE 0.72 0.70  CALCIUM 8.7* 8.4*   PT/INR No results for input(s): LABPROT, INR in the last 72 hours. ABG No results for input(s): PHART, HCO3 in the last 72 hours.  Invalid input(s): PCO2, PO2  Studies/Results: US Transvaginal Non-ob  Result Date: 04/29/2017 CLINICAL DATA:  21 year old female with pelvic pain. EXAM: TRANSABDOMINAL AND TRANSVAGINAL ULTRASOUND OF PELVIS DOPPLER ULTRASOUND OF OVARIES TECHNIQUE: Both transabdominal and transvaginal ultrasound examinations of the pelvis were performed. Transabdominal technique was performed for global imaging of the pelvis including uterus, ovaries, adnexal regions, and pelvic cul-de-sac. It was necessary to proceed with endovaginal exam following the transabdominal exam to visualize the ovaries. Color and duplex Doppler ultrasound was utilized to evaluate blood flow to the ovaries. COMPARISON:   None. FINDINGS: Uterus Measurements: 7.2 x 4.7 x 5.5 cm. Retroverted. No fibroids or other mass visualized. Endometrium Thickness: 3 mm.  No focal abnormality visualized. Right ovary Measurements: 3.8 x 2.2 x 2.0 cm. Multiple small follicles (image 73). Normal appearance/no adnexal mass. Left ovary Measurements: 4.1 x 1.9 x 1.7 cm. Multiple small follicles (image 85). Normal appearance/no adnexal mass. Pulsed Doppler evaluation of both ovaries demonstrates normal low-resistance arterial and venous waveforms. Other findings Trace free fluid near the left ovary. IMPRESSION: Negative for ovarian torsion. Uterus and both ovaries appear within normal limits. Electronically Signed   By: Odessa Fleming M.D.   On: 04/29/2017 12:36   US Pelvis Complete  Result Date: 04/29/2017 CLINICAL DATA:  21 year old female with pelvic pain. EXAM: TRANSABDOMINAL AND TRANSVAGINAL ULTRASOUND OF PELVIS DOPPLER ULTRASOUND OF OVARIES TECHNIQUE: Both transabdominal and transvaginal ultrasound examinations of the pelvis were performed. Transabdominal technique was performed for global imaging of the pelvis including uterus, ovaries, adnexal regions, and pelvic cul-de-sac. It was necessary to proceed with endovaginal exam following the transabdominal exam to visualize the ovaries. Color and duplex Doppler ultrasound was utilized to evaluate blood flow to the ovaries. COMPARISON:  None. FINDINGS: Uterus Measurements: 7.2 x 4.7 x 5.5 cm. Retroverted. No fibroids or other mass visualized. Endometrium Thickness: 3 mm.  No focal abnormality visualized. Right ovary Measurements: 3.8 x 2.2 x 2.0 cm. Multiple small follicles (image 73). Normal appearance/no adnexal mass. Left ovary Measurements: 4.1 x 1.9 x 1.7 cm. Multiple small follicles (image 85). Normal appearance/no adnexal mass. Pulsed Doppler evaluation of both ovaries demonstrates normal low-resistance arterial and venous waveforms. Other findings Trace  free fluid near the left ovary. IMPRESSION:  Negative for ovarian torsion. Uterus and both ovaries appear within normal limits. Electronically Signed   By: Odessa Fleming M.D.   On: 04/29/2017 12:36   Ct Abdomen Pelvis W Contrast  Result Date: 04/30/2017 CLINICAL DATA:  Abdominal and pelvic pain for several hours EXAM: CT ABDOMEN AND PELVIS WITH CONTRAST TECHNIQUE: Multidetector CT imaging of the abdomen and pelvis was performed using the standard protocol following bolus administration of intravenous contrast. CONTRAST:  ISOVUE-300 IOPAMIDOL (ISOVUE-300) INJECTION 61% COMPARISON:  None. FINDINGS: Lower chest: No acute abnormality. Hepatobiliary: No focal liver abnormality is seen. No gallstones, gallbladder wall thickening, or biliary dilatation. Pancreas: Unremarkable. No pancreatic ductal dilatation or surrounding inflammatory changes. Spleen: Normal in size without focal abnormality. Adrenals/Urinary Tract: The adrenal glands are within normal limits. The kidneys demonstrate a normal enhancement pattern bilaterally. The bladder is partially distended. Stomach/Bowel: The appendix is within normal limits. The colon is predominately decompressed. Multiple dilated loops of small bowel are identified within the mid abdomen. Some fecalization of bowel contents is noted within the small bowel. The transition point appears to be in the mid to distal abdomen just to the right of the midline best seen on image number 47 of series 2 and image number 36 of series 602. No definitive mass lesion is noted to correspond with the transition point. The more distal small bowel appears within normal limits. Vascular/Lymphatic: No significant vascular findings are present. No enlarged abdominal or pelvic lymph nodes. Reproductive: Uterus and bilateral adnexa are unremarkable. Other: No abdominal wall hernia or abnormality. No abdominopelvic ascites. Musculoskeletal: No acute or significant osseous findings. IMPRESSION: Multiple dilated loops of small bowel with fecalization  of small bowel contents consistent with at least a partial small bowel obstruction. A transition point is noted in the mid abdomen to the right of the midline although no mass lesion is noted at the transition point. Continued follow-up exam is recommended. No other focal abnormality is seen. Electronically Signed   By: Alcide Clever M.D.   On: 04/30/2017 11:06   [place] Art/ven Flow Abd Pelv Doppler  Result Date: 04/29/2017 CLINICAL DATA:  21 year old female with pelvic pain. EXAM: TRANSABDOMINAL AND TRANSVAGINAL ULTRASOUND OF PELVIS DOPPLER ULTRASOUND OF OVARIES TECHNIQUE: Both transabdominal and transvaginal ultrasound examinations of the pelvis were performed. Transabdominal technique was performed for global imaging of the pelvis including uterus, ovaries, adnexal regions, and pelvic cul-de-sac. It was necessary to proceed with endovaginal exam following the transabdominal exam to visualize the ovaries. Color and duplex Doppler ultrasound was utilized to evaluate blood flow to the ovaries. COMPARISON:  None. FINDINGS: Uterus Measurements: 7.2 x 4.7 x 5.5 cm. Retroverted. No fibroids or other mass visualized. Endometrium Thickness: 3 mm.  No focal abnormality visualized. Right ovary Measurements: 3.8 x 2.2 x 2.0 cm. Multiple small follicles (image 73). Normal appearance/no adnexal mass. Left ovary Measurements: 4.1 x 1.9 x 1.7 cm. Multiple small follicles (image 85). Normal appearance/no adnexal mass. Pulsed Doppler evaluation of both ovaries demonstrates normal low-resistance arterial and venous waveforms. Other findings Trace free fluid near the left ovary. IMPRESSION: Negative for ovarian torsion. Uterus and both ovaries appear within normal limits. Electronically Signed   By: Odessa Fleming M.D.   On: 04/29/2017 12:36    Anti-infectives: Anti-infectives    Start     Dose/Rate Route Frequency Ordered Stop   04/30/17 1300  metroNIDAZOLE (FLAGYL) IVPB 500 mg     500 mg 100 mL/hr over 60 Minutes Intravenous  Every  12 hours 04/30/17 1200 05/04/17 0059      Assessment/Plan: s/p * No surgery found * Will go ahead with SB protocol. She can drink the contrast. Continue NPO for now.   LOS: 1 day    Berna Bue 05/01/2017

## 2017-05-01 NOTE — Progress Notes (Signed)
Pt vomited 400+ liquid green emesis- abd pain & bloating has improved  - zofran given

## 2017-05-02 ENCOUNTER — Inpatient Hospital Stay (HOSPITAL_COMMUNITY): Payer: BLUE CROSS/BLUE SHIELD

## 2017-05-02 LAB — CBC WITH DIFFERENTIAL/PLATELET
BASOS ABS: 0 10*3/uL (ref 0.0–0.1)
BASOS PCT: 0 %
EOS ABS: 0.1 10*3/uL (ref 0.0–0.7)
EOS PCT: 1 %
HEMATOCRIT: 31.5 % — AB (ref 36.0–46.0)
Hemoglobin: 10.2 g/dL — ABNORMAL LOW (ref 12.0–15.0)
Lymphocytes Relative: 20 %
Lymphs Abs: 1.8 10*3/uL (ref 0.7–4.0)
MCH: 25.4 pg — ABNORMAL LOW (ref 26.0–34.0)
MCHC: 32.4 g/dL (ref 30.0–36.0)
MCV: 78.6 fL (ref 78.0–100.0)
MONO ABS: 0.9 10*3/uL (ref 0.1–1.0)
MONOS PCT: 10 %
NEUTROS ABS: 6.5 10*3/uL (ref 1.7–7.7)
Neutrophils Relative %: 69 %
PLATELETS: 299 10*3/uL (ref 150–400)
RBC: 4.01 MIL/uL (ref 3.87–5.11)
RDW: 13.9 % (ref 11.5–15.5)
WBC: 9.3 10*3/uL (ref 4.0–10.5)

## 2017-05-02 LAB — BASIC METABOLIC PANEL
Anion gap: 7 (ref 5–15)
BUN: 13 mg/dL (ref 6–20)
CALCIUM: 8.5 mg/dL — AB (ref 8.9–10.3)
CO2: 24 mmol/L (ref 22–32)
CREATININE: 0.67 mg/dL (ref 0.44–1.00)
Chloride: 106 mmol/L (ref 101–111)
GFR calc Af Amer: >60 mL/min (ref >60)
Glucose, Bld: 95 mg/dL (ref 65–99)
POTASSIUM: 3.6 mmol/L (ref 3.5–5.1)
Sodium: 137 mmol/L (ref 135–145)

## 2017-05-02 LAB — IRON AND TIBC
Iron: 44 ug/dL (ref 28–170)
Saturation Ratios: 13 % (ref 10.4–31.8)
TIBC: 342 ug/dL (ref 250–450)
UIBC: 298 ug/dL

## 2017-05-02 LAB — RETICULOCYTES
RBC.: 4.01 MIL/uL (ref 3.87–5.11)
RETIC CT PCT: 1.3 % (ref 0.4–3.1)
Retic Count, Absolute: 52.1 10*3/uL (ref 19.0–186.0)

## 2017-05-02 LAB — TSH: TSH: 1.815 u[IU]/mL (ref 0.350–4.500)

## 2017-05-02 LAB — FOLATE: FOLATE: 13 ng/mL (ref >5.9)

## 2017-05-02 LAB — MAGNESIUM: MAGNESIUM: 1.7 mg/dL (ref 1.7–2.4)

## 2017-05-02 LAB — VITAMIN B12: VITAMIN B 12: 262 pg/mL (ref 180–914)

## 2017-05-02 MED ORDER — IOPAMIDOL (ISOVUE-300) INJECTION 61%
INTRAVENOUS | Status: AC
  Filled 2017-05-02: qty 50

## 2017-05-02 MED ORDER — POTASSIUM CHLORIDE 10 MEQ/100ML IV SOLN
10.0000 meq | INTRAVENOUS | Status: AC
  Administered 2017-05-02 (×4): 10 meq via INTRAVENOUS
  Filled 2017-05-02 (×4): qty 100

## 2017-05-02 MED ORDER — MAGNESIUM SULFATE 2 GM/50ML IV SOLN
2.0000 g | Freq: Once | INTRAVENOUS | Status: AC
  Administered 2017-05-02: 2 g via INTRAVENOUS
  Filled 2017-05-02: qty 50

## 2017-05-02 MED ORDER — LIDOCAINE HCL 2 % EX GEL
CUTANEOUS | Status: AC
  Filled 2017-05-02: qty 30

## 2017-05-02 NOTE — Progress Notes (Signed)
Ice chips provided - explained to enjoy them slowly and no drinking water, strongly encouraged walking and the benefits of walking

## 2017-05-02 NOTE — Progress Notes (Signed)
Subjective/Chief Complaint: Had emesis after contrast yesterday. No BM or flatus. Contrast still in LUQ.    Objective: Vital signs in last 24 hours: Temp:  [98.3 F (36.8 C)-99 F (37.2 C)] 98.3 F (36.8 C) (10/07 0615) Pulse Rate:  [74-76] 76 (10/07 0615) Resp:  [15-16] 16 (10/07 0615) BP: (123-137)/(58-70) 137/70 (10/07 0615) SpO2:  [99 %-100 %] 100 % (10/07 0615) Last BM Date: 04/29/17  Intake/Output from previous day: 10/06 0701 - 10/07 0700 In: 1500 [I.V.:1500] Out: 500 [Emesis/NG output:500] Intake/Output this shift: No intake/output data recorded.  General appearance: alert and cooperative Resp: clear to auscultation bilaterally GI: soft, mildly distended, mildly diffusely tender Skin: Skin color, texture, turgor normal. No rashes or lesions  Lab Results:   Recent Labs  05/01/17 0426 05/02/17 0428  WBC 8.5 9.3  HGB 9.9* 10.2*  HCT 30.4* 31.5*  PLT 319 299   BMET  Recent Labs  05/01/17 0426 05/02/17 0428  NA 139 137  K 3.7 3.6  CL 108 106  CO2 26 24  GLUCOSE 90 95  BUN 16 13  CREATININE 0.70 0.67  CALCIUM 8.4* 8.5*   PT/INR No results for input(s): LABPROT, INR in the last 72 hours. ABG No results for input(s): PHART, HCO3 in the last 72 hours.  Invalid input(s): PCO2, PO2  Studies/Results: Ct Abdomen Pelvis W Contrast  Result Date: 04/30/2017 CLINICAL DATA:  Abdominal and pelvic pain for several hours EXAM: CT ABDOMEN AND PELVIS WITH CONTRAST TECHNIQUE: Multidetector CT imaging of the abdomen and pelvis was performed using the standard protocol following bolus administration of intravenous contrast. CONTRAST:  ISOVUE-300 IOPAMIDOL (ISOVUE-300) INJECTION 61% COMPARISON:  None. FINDINGS: Lower chest: No acute abnormality. Hepatobiliary: No focal liver abnormality is seen. No gallstones, gallbladder wall thickening, or biliary dilatation. Pancreas: Unremarkable. No pancreatic ductal dilatation or surrounding inflammatory changes. Spleen:  Normal in size without focal abnormality. Adrenals/Urinary Tract: The adrenal glands are within normal limits. The kidneys demonstrate a normal enhancement pattern bilaterally. The bladder is partially distended. Stomach/Bowel: The appendix is within normal limits. The colon is predominately decompressed. Multiple dilated loops of small bowel are identified within the mid abdomen. Some fecalization of bowel contents is noted within the small bowel. The transition point appears to be in the mid to distal abdomen just to the right of the midline best seen on image number 47 of series 2 and image number 36 of series 602. No definitive mass lesion is noted to correspond with the transition point. The more distal small bowel appears within normal limits. Vascular/Lymphatic: No significant vascular findings are present. No enlarged abdominal or pelvic lymph nodes. Reproductive: Uterus and bilateral adnexa are unremarkable. Other: No abdominal wall hernia or abnormality. No abdominopelvic ascites. Musculoskeletal: No acute or significant osseous findings. IMPRESSION: Multiple dilated loops of small bowel with fecalization of small bowel contents consistent with at least a partial small bowel obstruction. A transition point is noted in the mid abdomen to the right of the midline although no mass lesion is noted at the transition point. Continued follow-up exam is recommended. No other focal abnormality is seen. Electronically Signed   By: Alcide Clever M.D.   On: 04/30/2017 11:06   Dg Abd Portable 1v-small Bowel Obstruction Protocol-initial, 8 Hr Delay  Result Date: 05/01/2017 CLINICAL DATA:  Small-bowel obstruction, 8 hour delayed radiographs. EXAM: PORTABLE ABDOMEN - 1 VIEW COMPARISON:  None. FINDINGS: Contrast is seen in the expected location of the stomach and proximal small intestine. Dilated small bowel  loops persists with gas seen in the ascending and proximal transverse colon. No radio-opaque calculi or other  significant radiographic abnormality are seen. IMPRESSION: High-grade partial or early SBO persists. Enteric contrast is seen in the left upper quadrant in what appear to be the stomach and proximal small intestine. Electronically Signed   By: Tollie Eth M.D.   On: 05/01/2017 22:06    Anti-infectives: Anti-infectives    Start     Dose/Rate Route Frequency Ordered Stop   04/30/17 1300  metroNIDAZOLE (FLAGYL) IVPB 500 mg     500 mg 100 mL/hr over 60 Minutes Intravenous Every 12 hours 04/30/17 1200 05/04/17 0059      Assessment/Plan: s/p * No surgery found * I discussed with her and her father at bedside options at this point- I recommend we TRY to get an NG down with fluoro to decompress and see if that will help things move through. She is reluctant. Alternatives would be to wait for the 24hr film or proceed to laparotomy. She will consider NG.    LOS: 2 days    Berna Bue 05/02/2017

## 2017-05-02 NOTE — Progress Notes (Signed)
Patient would not allow NG placement. This has been attempted by nursing, radiology, and myself multiple times. She will not permit the tube beyond about 2cm into her nose and pushes my hand away repeatedly.  Continue strict NPO Will follow up 24h delay films Anticipate she will need surgery early this week.

## 2017-05-02 NOTE — Progress Notes (Signed)
PROGRESS NOTE  Medina Degraffenreid ZOX:[id number] DOB: Dec 23, 1995 DOA: 04/30/2017 PCP: Patient, No Pcp Per  HPI/Recap of past 24 hours:  She continued to have ab pain, and vomiting, no bm/flatus No fever,  Multiple family at bedside   Assessment/Plan: Principal Problem:   SBO (small bowel obstruction) (HCC) Active Problems:   BV (bacterial vaginosis)   Small bowel obstruction -Patient's first occurrence of this issue with distant history of abdominal surgery. Possibly use of muscle relaxer which slows the bowel could've been a trigger worsening pains or this could've been brewing for days. -She did not tolerate NG placement on presentation. small bowel protocol started, she got the contrast by mouth on 10/6. -she is npo, on ivf, prn anagesics, no improvement, fluoroscope guided NG placement  today per General surgery  -management per general surgery, case discussed with general sugery  Hypokalemia/hypomagnesemia: replace k/mag   Poor IV access:  PICC line placed due to difficulty getting access and lab work drawn  Bacterial vaginosis Continue therapy with Flagyl IV  Anemia: mcv 77-78 hgb 11.8- 9.9 No acute sign of bleeding, tbili 0.6. Anemia work up with retic inappropriately low , tsh wnl, b12, folate, iron pending  Code Status: Full Code DVT Prophylaxis: Heparin (she refused), SCDs Family Communication: patient and multiple family at bedside Disposition Plan: Pending Improvement    Consultants:  general surgery  Procedures:  Failed bedside  ng placement on admission  fluoroscope guided ng placement on 10/7  Antibiotics:  Iv flagyl   Objective: BP 137/70 (BP Location: Right Arm)   Pulse 76   Temp 98.3 F (36.8 C) (Oral)   Resp 16   Ht  (1.626 m)   Wt 61.2 kg (135 lb)   LMP 04/24/2017 (Exact Date) Comment: negative HCG blood test 04-30-2017  SpO2 100%   BMI 23.17 kg/m   Intake/Output Summary (Last 24 hours) at 05/02/17 1056 Last data filed at  05/01/17 2200  Gross per 24 hour  Intake             1500 ml  Output              500 ml  Net             1000 ml   Filed Weights   04/30/17 0758  Weight: 61.2 kg (135 lb)    Exam: Patient is examined daily including today on 05/02/2017, exams remain the same as of yesterday except that has changed    General:  NAD  Cardiovascular: RRR  Respiratory: CTABL  Abdomen: slight tender, mild distention, overall still soft, positive BS (all four quadrant, slightly decreased left lower quadrant  Musculoskeletal: No Edema  Neuro: alert, oriented   Data Reviewed: Basic Metabolic Panel:  Recent Labs Lab 04/29/17 0525 04/30/17 1236 05/01/17 0426 05/02/17 0428  NA 136 136 139 137  K 4.1 3.6 3.7 3.6  CL 101 101 108 106  CO2 GLUCOSE 118* 101* 90 95  BUN CREATININE 0.83 0.72 0.70 0.67  CALCIUM 9.5 8.7* 8.4* 8.5*  MG  --   --   --  1.7   Liver Function Tests:  Recent Labs Lab 04/30/17 1236  AST 17  ALT 15  ALKPHOS 51  BILITOT 0.6  PROT 7.1  ALBUMIN 3.9    Recent Labs Lab 04/29/17 0525  LIPASE 36   No results for input(s): AMMONIA in the last 168 hours. CBC:  Recent Labs Lab 04/29/17 0525  04/30/17 0942 04/30/17 1236 05/01/17 0426 05/02/17 0428  WBC 10.9* 12.7* 12.5* 8.5 9.3  NEUTROABS  --  10.2*  --   --  6.5  HGB 11.7* 12.6 11.8* 9.9* 10.2*  HCT 37.4 38.6 36.1 30.4* 31.5*  MCV 78.2 77.7* 77.8* 78.8 78.6  PLT 303 383 370 319 299   Cardiac Enzymes:   No results for input(s): CKTOTAL, CKMB, CKMBINDEX, TROPONINI in the last 168 hours. BNP (last 3 results) No results for input(s): BNP in the last 8760 hours.  ProBNP (last 3 results) No results for input(s): PROBNP in the last 8760 hours.  CBG: No results for input(s): GLUCAP in the last 168 hours.  No results found for this or any previous visit (from the past 240 hour(s)).   Studies: Dg Abd Portable 1v-small Bowel Obstruction Protocol-initial, 8 Hr Delay  Result Date:  05/01/2017 CLINICAL DATA:  Small-bowel obstruction, 8 hour delayed radiographs. EXAM: PORTABLE ABDOMEN - 1 VIEW COMPARISON:  None. FINDINGS: Contrast is seen in the expected location of the stomach and proximal small intestine. Dilated small bowel loops persists with gas seen in the ascending and proximal transverse colon. No radio-opaque calculi or other significant radiographic abnormality are seen. IMPRESSION: High-grade partial or early SBO persists. Enteric contrast is seen in the left upper quadrant in what appear to be the stomach and proximal small intestine. Electronically Signed   By: Tollie Eth M.D.   On: 05/01/2017 22:06    Scheduled Meds: . heparin  5,000 Units Subcutaneous Q8H    Continuous Infusions: . sodium chloride 125 mL/hr at 04/30/17 2132  . magnesium sulfate 1 - 4 g bolus IVPB    . metronidazole 500 mg (05/02/17 0039)  . potassium chloride       Time spent: 25 mins I have personally reviewed and interpreted on  05/02/2017 daily labs,imagings as discussed above under date review session and assessment and plans.  I reviewed all nursing notes, consultant notes,  vitals, pertinent old records  I have discussed plan of care as described above with RN , patient and family on 05/02/2017   Sam Overbeck MD, PhD  [facility] Pager [phone]. If 7PM-7AM, please contact night-coverage at [email or web address], password [facility] 05/02/2017, 10:56 AM  LOS: 2 days

## 2017-05-03 LAB — BASIC METABOLIC PANEL
ANION GAP: 8 (ref 5–15)
BUN: 9 mg/dL (ref 6–20)
CO2: 23 mmol/L (ref 22–32)
Calcium: 8 mg/dL — ABNORMAL LOW (ref 8.9–10.3)
Chloride: 103 mmol/L (ref 101–111)
Creatinine, Ser: 0.69 mg/dL (ref 0.44–1.00)
GFR calc Af Amer: >60 mL/min (ref >60)
GLUCOSE: 88 mg/dL (ref 65–99)
POTASSIUM: 3.8 mmol/L (ref 3.5–5.1)
Sodium: 134 mmol/L — ABNORMAL LOW (ref 135–145)

## 2017-05-03 LAB — MAGNESIUM: Magnesium: 1.9 mg/dL (ref 1.7–2.4)

## 2017-05-03 LAB — LACTIC ACID, PLASMA: Lactic Acid, Venous: 0.7 mmol/L (ref 0.5–1.9)

## 2017-05-03 NOTE — Progress Notes (Signed)
    CC:  RLQ pain, nausea and vomiting  Subjective: No NG, no nausea or vomiting, had a BM yesterday, nothing today, passing flatus, still a little tender, but seems better.    Objective: Vital signs in last 24 hours: Temp:  [97.4 F (36.3 C)-98.5 F (36.9 C)] 98.5 F (36.9 C) (10/08 0628) Pulse Rate:  [70-83] 70 (10/08 0628) Resp:  [15-16] 16 (10/08 0628) BP: (121-126)/(70-75) 126/75 (10/08 0628) SpO2:  [100 %] 100 % (10/08 0628) Last BM Date: 05/02/17  Intake/Output from previous day: 10/07 0701 - 10/08 0700 In: 3091.7 [I.V.:2591.7; IV Piggyback:500] Out: -  Intake/Output this shift: No intake/output data recorded.  General appearance: alert, cooperative and no distress GI: soft, minimally tender, few BS, flauts today, small BM yesterday  Lab Results:   Recent Labs  05/01/17 0426 05/02/17 0428  WBC 8.5 9.3  HGB 9.9* 10.2*  HCT 30.4* 31.5*  PLT 319 299    BMET  Recent Labs  05/02/17 0428 05/03/17 0516  NA 137 134*  K 3.6 3.8  CL 106 103  CO2 24 23  GLUCOSE 95 88  BUN 13 9  CREATININE 0.67 0.69  CALCIUM 8.5* 8.0*   PT/INR No results for input(s): LABPROT, INR in the last 72 hours.   Recent Labs Lab 04/30/17 1236  AST 17  ALT 15  ALKPHOS 51  BILITOT 0.6  PROT 7.1  ALBUMIN 3.9     Lipase     Component Value Date/Time   LIPASE 36 04/29/2017 0525     Medications: . heparin  5,000 Units Subcutaneous Q8H   . sodium chloride 125 mL/hr at 05/03/17 1043  . metronidazole Stopped (05/03/17 0130)   Anti-infectives    Start     Dose/Rate Route Frequency Ordered Stop   04/30/17 1300  metroNIDAZOLE (FLAGYL) IVPB 500 mg     500 mg 100 mL/hr over 60 Minutes Intravenous Every 12 hours 04/30/17 1200 05/04/17 0059      Assessment/Plan SBO Prior abdominal surgery with RLQ scar - Previous surgery as a 1 day old child likely for pyloromyotomy. Bacterial vaginosis - treated Anemia FEN:  IV fluids/NPO - refused NG ID:  Flagyl 10/5-10/9/18 -  completed  DVT: Heparin  Plan:  I am going to start her on come clears and see how she does.  I told her if she has nausea she has to hold off on PO's.  Dr. Roda Shutters would like to transfer to our service.      LOS: 3 days    Jabree Rebert 05/03/2017 [phone]

## 2017-05-03 NOTE — Care Management Note (Signed)
Case Management Note  Patient Details  Name: Tamara Pugh MRN: [id number] Date of Birth: 20-Dec-1995  Subjective/Objective:                  SBO  Action/Plan: Date:  May 03, 2017 Chart reviewed for concurrent status and case management needs.  Will continue to follow patient progress.  Discharge Planning: following for needs  Expected discharge date: [id number]  Marcelle Smiling, BSN, [place], [place]   [phone]   Expected Discharge Date:   (unknown)               Expected Discharge Plan:  Home/Self Care  In-House Referral:     Discharge planning Services  CM Consult  Post Acute Care Choice:    Choice offered to:     DME Arranged:    DME Agency:     HH Arranged:    HH Agency:     Status of Service:  In process, will continue to follow  If discussed at Long Length of Stay Meetings, dates discussed:    Additional Comments:  Golda Acre, RN 05/03/2017, 9:55 AM

## 2017-05-03 NOTE — Progress Notes (Signed)
PROGRESS NOTE  Tamara Pugh ZOX:[id number] DOB: 07/10/1996 DOA: 04/30/2017 PCP: Patient, No Pcp Per  HPI/Recap of past 24 hours:  Less pain, start to have flatus, no n/v She is ambulating  No fever,  Multiple family at bedside   Assessment/Plan: Principal Problem:   SBO (small bowel obstruction) (HCC) Active Problems:   BV (bacterial vaginosis)   Small bowel obstruction -Patient's first occurrence of this issue with distant history of abdominal surgery. Possibly use of muscle relaxer which slows the bowel could've been a trigger worsening pains or this could've been brewing for days. -She did not tolerate NG placement on presentation. small bowel protocol started, she got the contrast by mouth on 10/6. - failed fluoroscope guided NG placement on 10/7  -she is npo, on ivf, prn anagesics, some improvement, -management per general surgery, case discussed with general sugery  Hypokalemia/hypomagnesemia: replace k/mag, keep k> 4, mag >2   Poor IV access:  PICC line placed due to difficulty getting access and lab work drawn  Bacterial vaginosis  therapy with Flagyl IV, last dose of 10/8  Anemia: mcv 77-78 hgb 11.8- 9.9 No acute sign of bleeding, tbili 0.6. Anemia work up with retic inappropriately low , tsh wnl, b12, folate, iron panel unjremarkable  Code Status: Full Code DVT Prophylaxis: Heparin (she refused), SCDs Family Communication: patient and multiple family at bedside Disposition Plan: Pending Improvement    Consultants:  general surgery  Procedures:  Failed bedside  ng placement on admission  Failed fluoroscope guided ng placement on 10/7  Antibiotics:  Iv flagyl from admission to 10/8   Objective: BP 124/86 (BP Location: Right Arm)   Pulse 65   Temp 98.3 F (36.8 C) (Oral)   Resp 16   Ht  (1.626 m)   Wt 61.2 kg (135 lb)   LMP 04/24/2017 (Exact Date) Comment: negative HCG blood test 04-30-2017  SpO2 100%   BMI 23.17 kg/m    Intake/Output Summary (Last 24 hours) at 05/03/17 1953 Last data filed at 05/03/17 1500  Gross per 24 hour  Intake          3577.92 ml  Output                0 ml  Net          3577.92 ml   Filed Weights   04/30/17 0758  Weight: 61.2 kg (135 lb)    Exam: Patient is examined daily including today on 05/03/2017, exams remain the same as of yesterday except that has changed    General:  NAD  Cardiovascular: RRR  Respiratory: CTABL  Abdomen:  Right upper quadrant well healed surgical scar. Ab less tender, overall soft, positive BS   Musculoskeletal: No Edema  Neuro: alert, oriented   Data Reviewed: Basic Metabolic Panel:  Recent Labs Lab 04/29/17 0525 04/30/17 1236 05/01/17 0426 05/02/17 0428 05/03/17 0516  NA 136 136 139 137 134*  K 4.1 3.6 3.7 3.6 3.8  CL 101 101 108 106 103  CO2 GLUCOSE 118* 101* 90 95 88  BUN CREATININE 0.83 0.72 0.70 0.67 0.69  CALCIUM 9.5 8.7* 8.4* 8.5* 8.0*  MG  --   --   --  1.7 1.9   Liver Function Tests:  Recent Labs Lab 04/30/17 1236  AST 17  ALT 15  ALKPHOS 51  BILITOT 0.6  PROT 7.1  ALBUMIN 3.9    Recent Labs Lab 04/29/17 0525  LIPASE 36   No results for input(s): AMMONIA in the last 168 hours. CBC:  Recent Labs Lab 04/29/17 0525 04/30/17 0942 04/30/17 1236 05/01/17 0426 05/02/17 0428  WBC 10.9* 12.7* 12.5* 8.5 9.3  NEUTROABS  --  10.2*  --   --  6.5  HGB 11.7* 12.6 11.8* 9.9* 10.2*  HCT 37.4 38.6 36.1 30.4* 31.5*  MCV 78.2 77.7* 77.8* 78.8 78.6  PLT 303 383 370 319 299   Cardiac Enzymes:   No results for input(s): CKTOTAL, CKMB, CKMBINDEX, TROPONINI in the last 168 hours. BNP (last 3 results) No results for input(s): BNP in the last 8760 hours.  ProBNP (last 3 results) No results for input(s): PROBNP in the last 8760 hours.  CBG: No results for input(s): GLUCAP in the last 168 hours.  No results found for this or any previous visit (from the past 240 hour(s)).    Studies: No results found.  Scheduled Meds: . heparin  5,000 Units Subcutaneous Q8H    Continuous Infusions: . sodium chloride 125 mL/hr at 05/03/17 1043     Time spent: 15 mins I have personally reviewed and interpreted on  05/03/2017 daily labs,  I reviewed all nursing notes, consultant notes,  vitals, pertinent old records  I have discussed plan of care as described above with RN , patient and family on 05/03/2017  Case discussed with general surgery.  Jahna Liebert MD, PhD  [facility] Pager [phone]. If 7PM-7AM, please contact night-coverage at [email or web address], password [facility] 05/03/2017, 7:53 PM  LOS: 3 days

## 2017-05-03 NOTE — Progress Notes (Signed)
Assumed care of patient from previous RN.  Agree with previous RN's assessment of patient.  Will continue to monitor. 

## 2017-05-03 NOTE — Progress Notes (Signed)
Assumed care of patient from previous RN. Agree with previous RNs assessment of the pt. Will continue to monitor.

## 2017-05-04 LAB — BASIC METABOLIC PANEL
ANION GAP: 6 (ref 5–15)
BUN: 8 mg/dL (ref 6–20)
CHLORIDE: 107 mmol/L (ref 101–111)
CO2: 25 mmol/L (ref 22–32)
Calcium: 8.5 mg/dL — ABNORMAL LOW (ref 8.9–10.3)
Creatinine, Ser: 0.72 mg/dL (ref 0.44–1.00)
Glucose, Bld: 97 mg/dL (ref 65–99)
POTASSIUM: 3.6 mmol/L (ref 3.5–5.1)
SODIUM: 138 mmol/L (ref 135–145)

## 2017-05-04 LAB — CBC WITH DIFFERENTIAL/PLATELET
BASOS ABS: 0 10*3/uL (ref 0.0–0.1)
BASOS PCT: 1 %
EOS ABS: 0.1 10*3/uL (ref 0.0–0.7)
Eosinophils Relative: 2 %
HCT: 29.3 % — ABNORMAL LOW (ref 36.0–46.0)
HEMOGLOBIN: 9.5 g/dL — AB (ref 12.0–15.0)
LYMPHS ABS: 2.3 10*3/uL (ref 0.7–4.0)
Lymphocytes Relative: 36 %
MCH: 25.1 pg — AB (ref 26.0–34.0)
MCHC: 32.4 g/dL (ref 30.0–36.0)
MCV: 77.5 fL — ABNORMAL LOW (ref 78.0–100.0)
Monocytes Absolute: 0.5 10*3/uL (ref 0.1–1.0)
Monocytes Relative: 8 %
NEUTROS PCT: 53 %
Neutro Abs: 3.4 10*3/uL (ref 1.7–7.7)
PLATELETS: 284 10*3/uL (ref 150–400)
RBC: 3.78 MIL/uL — AB (ref 3.87–5.11)
RDW: 13.7 % (ref 11.5–15.5)
WBC: 6.4 10*3/uL (ref 4.0–10.5)

## 2017-05-04 LAB — MAGNESIUM: MAGNESIUM: 1.7 mg/dL (ref 1.7–2.4)

## 2017-05-04 NOTE — Progress Notes (Signed)
Patient seen and examined, family updated. Patient is improving, General surgery taking over the care. hospitalist will sign off.  Please call if questions.

## 2017-05-04 NOTE — Progress Notes (Signed)
Patient ID: Tamara Pugh, female   DOB: [birth year], 21 y.o.   MRN: [id number]   Acute Care Surgery Service Progress Note:    Chief Complaint/Subjective: Reports another BM, flatus; not much burping. Clears went well  Objective: Vital signs in last 24 hours: Temp:  [98.3 F (36.8 C)-98.7 F (37.1 C)] 98.5 F (36.9 C) (10/09 0500) Pulse Rate:  [65-69] 69 (10/09 0500) Resp:  [16] 16 (10/09 0500) BP: (116-125)/(82-95) 116/82 (10/09 0500) SpO2:  [100 %] 100 % (10/09 0500) Last BM Date: 05/03/17  Intake/Output from previous day: 10/08 0701 - 10/09 0700 In: 1986.3 [P.O.:480; I.V.:1406.3; IV Piggyback:100] Out: -  Intake/Output this shift: No intake/output data recorded.  Lungs: cta, nonlabored  Cardiovascular: reg  Abd: soft, nt, nd  Extremities: no edema, +SCDs  Neuro: alert, nonfocal  Lab Results: CBC   Recent Labs  05/02/17 0428 05/04/17 0437  WBC 9.3 6.4  HGB 10.2* 9.5*  HCT 31.5* 29.3*  PLT 299 284   BMET  Recent Labs  05/03/17 0516 05/04/17 0437  NA 134* 138  K 3.8 3.6  CL 103 107  CO2 23 25  GLUCOSE 88 97  BUN 9 8  CREATININE 0.69 0.72  CALCIUM 8.0* 8.5*   LFT Hepatic Function Latest Ref Rng & Units 04/30/2017  Total Protein 6.5 - 8.1 g/dL 7.1  Albumin 3.5 - 5.0 g/dL 3.9  AST 15 - 41 U/L 17  ALT 14 - 54 U/L 15  Alk Phosphatase 38 - 126 U/L 51  Total Bilirubin 0.3 - 1.2 mg/dL 0.6   PT/INR No results for input(s): LABPROT, INR in the last 72 hours. ABG No results for input(s): PHART, HCO3 in the last 72 hours.  Invalid input(s): PCO2, PO2  Studies/Results:  Anti-infectives: Anti-infectives    Start     Dose/Rate Route Frequency Ordered Stop   04/30/17 1300  metroNIDAZOLE (FLAGYL) IVPB 500 mg     500 mg 100 mL/hr over 60 Minutes Intravenous Every 12 hours 04/30/17 1200 05/03/17 1455      Medications: Scheduled Meds: . heparin  5,000 Units Subcutaneous Q8H   Continuous Infusions: . sodium chloride 125 mL/hr at 05/03/17 2013    PRN Meds:.acetaminophen **OR** acetaminophen, hydrALAZINE, LORazepam, morphine injection, ondansetron **OR** ondansetron (ZOFRAN) IV, sodium chloride flush  Assessment/Plan: Patient Active Problem List   Diagnosis Date Noted  . SBO (small bowel obstruction) ([place]) 04/30/2017  . BV (bacterial vaginosis) 04/30/2017  SBO Prior abdominal surgery with RLQ scar - Previous surgery as a 1 day old child likely for pyloromyotomy. Bacterial vaginosis - treated Anemia FEN:  Dec  IV fluids/adv diet to fulls ID:  Flagyl 10/5-10/9/18 - completed  DVT: Heparin   Disposition: psbo appears to be improving; tolerated clears, having bowel function; adv to fulls; if no issues probably dc mid day on Wednesday; discussed discharge diet and instructions with pt and mother via A[employer] video services    LOS: 4 days    Leighton Ruff. Redmond Pulling, MD, FACS General, Bariatric, & Minimally Invasive Surgery [phone] [facility]

## 2017-05-05 MED ORDER — ONDANSETRON 4 MG PO TBDP: 4.0000 mg | ORAL_TABLET | Freq: Three times a day (TID) | ORAL | 0 refills | Status: DC | PRN

## 2017-05-05 MED ORDER — ACETAMINOPHEN 325 MG PO TABS
650.0000 mg | ORAL_TABLET | Freq: Once | ORAL | Status: DC
  Filled 2017-05-05: qty 2

## 2017-05-05 NOTE — Discharge Summary (Signed)
Physician Discharge Summary  Patient ID: Tamara Pugh MRN: [id number] DOB/AGE: Feb 11, 1996 21 y.o.  Admit date: 04/30/2017 Discharge date: 05/06/2017  Admission Diagnoses:   SBO Prior abdominal surgery with RLQ scar - Previous surgery as a 1 day old child likely for pyloromyotomy. Bacterial vaginosis - treated Anemia  Discharge Diagnoses:  SBO Prior abdominal surgery with RLQ scar - Previous surgery as a 1 day old child likely for pyloromyotomy. Bacterial vaginosis - treated Anemia   Principal Problem:   SBO (small bowel obstruction) (HCC) Active Problems:   BV (bacterial vaginosis)   PROCEDURES: none  Hospital Course:  Tamara Pugh is a 21 y.o. female  with past medical history of feeding tube placement and removal presents to the hospital with nausea vomiting abdominal pain. Of note patient was recently seen in emergency room for abdominal pain and worked up for torsion was ruled out and sent home with a muscle relaxer. Also noteworthy is patient is currently being treated for bacterial vaginosis and was recently also treated for chlamydia. Patient states after leaving the emergency room yesterday her abdominal pain continued. She did not improve. She continued to have trouble eating and drinking which has been going on for at least the last 48 hours. No family history of small bowel obstructive. ED course: CT abdomen and pelvis done that showed partial small bowel obstruction. Hospitalist consulted for admission.  Her mother was later able to describe thru Sign language that she had a SBO when she was  1 day old, and we think this was most likely a pyloromyotomy.    She was admitted and placed on bowel rest, IV hydration and she deferred NG placement.  She was on bowel rest for some time,but finally started to open up. Her diet has been advanced and if she does well with a soft diet we plan to discharge later today.    CBC Latest Ref Rng & Units 05/04/2017 05/02/2017 05/01/2017  WBC  4.0 - 10.5 K/uL 6.4 9.3 8.5  Hemoglobin 12.0 - 15.0 g/dL 4.0(J) 10.2(L) 9.9(L)  Hematocrit 36.0 - 46.0 % 29.3(L) 31.5(L) 30.4(L)  Platelets 150 - 400 K/uL 284 299 319   CMP Latest Ref Rng & Units 05/04/2017 05/03/2017 05/02/2017  Glucose 65 - 99 mg/dL 97 88 95  BUN 6 - 20 mg/dL Creatinine 0.44 - 1.00 mg/dL 8.11 9.14 7.82  Sodium 135 - 145 mmol/L 138 134(L) 137  Potassium 3.5 - 5.1 mmol/L 3.6 3.8 3.6  Chloride 101 - 111 mmol/L 107 103 106  CO2 22 - 32 mmol/L Calcium 8.9 - 10.3 mg/dL 9.5(A) 2.1(H) 0.8(M)  Total Protein 6.5 - 8.1 g/dL - - -  Total Bilirubin 0.3 - 1.2 mg/dL - - -  Alkaline Phos 38 - 126 U/L - - -  AST 15 - 41 U/L - - -  ALT 14 - 54 U/L - - -    Disposition: 01-Home or Self Care   Allergies as of 05/05/2017   No Known Allergies     Medication List    STOP taking these medications   amoxicillin 400 MG/5ML suspension Commonly known as:  AMOXIL   azithromycin 500 MG tablet Commonly known as:  ZITHROMAX   metroNIDAZOLE 500 MG tablet Commonly known as:  FLAGYL     TAKE these medications   HYDROcodone-acetaminophen 5-325 MG tablet Commonly known as:  NORCO/VICODIN Take 2 tablets by mouth every 4 (four) hours as needed.   methocarbamol 750 MG tablet  Commonly known as:  ROBAXIN-750 Take 1 tablet (750 mg total) by mouth 4 (four) times daily.   naproxen 500 MG tablet Commonly known as:  NAPROSYN Take 1 tablet (500 mg total) by mouth 2 (two) times daily.   ondansetron 4 MG disintegrating tablet Commonly known as:  ZOFRAN ODT Take 1 tablet (4 mg total) by mouth every 8 (eight) hours as needed for nausea or vomiting.      Follow-up Information    [facility] Follow up.   Specialty:  General Surgery Why:    I would follow up with your primary or at school for medical issues.   No treatment will prevent this from occuring again.  If you have symptoms and cannot control at home, return to the Emergency room for evaluation.   stop eating when symptoms occur.  Contact information: [address] [place] [address] [phone]          Signed: Sherrie George 05/06/2017, 10:53 AM

## 2017-05-05 NOTE — Progress Notes (Signed)
    CC:  RLQ pain, nausea and vomiting  Subjective: She is doing pretty well, no abdominal pain she did have some mild nausea with some ice cream last PM, but not much.  She is fine today.  Hard to tell how much she is actually taking in.  1 BM yesterday  Objective: Vital signs in last 24 hours: Temp:  [98.3 F (36.8 C)-98.9 F (37.2 C)] 98.3 F (36.8 C) (10/10 0510) Pulse Rate:  [70-82] 70 (10/10 0510) Resp:  [16] 16 (10/10 0510) BP: (111-132)/(65-85) 111/65 (10/10 0510) SpO2:  [98 %-100 %] 98 % (10/10 0510) Last BM Date: 05/04/17 240 PO recorded 1333 IV 3400 urine BM x 1 recorded Afebrile, VSS No labs this AM Intake/Output from previous day: 10/09 0701 - 10/10 0700 In: 1573 [P.O.:240; I.V.:1333] Out: 3400 [Urine:3400] Intake/Output this shift: No intake/output data recorded.  General appearance: alert, cooperative and no distress Resp: clear to auscultation bilaterally GI: soft, non-tender; bowel sounds normal; no masses,  no organomegaly  Lab Results:   Recent Labs  05/04/17 0437  WBC 6.4  HGB 9.5*  HCT 29.3*  PLT 284    BMET  Recent Labs  05/03/17 0516 05/04/17 0437  NA 134* 138  K 3.8 3.6  CL 103 107  CO2 23 25  GLUCOSE 88 97  BUN 9 8  CREATININE 0.69 0.72  CALCIUM 8.0* 8.5*   PT/INR No results for input(s): LABPROT, INR in the last 72 hours.   Recent Labs Lab 04/30/17 1236  AST 17  ALT 15  ALKPHOS 51  BILITOT 0.6  PROT 7.1  ALBUMIN 3.9     Lipase     Component Value Date/Time   LIPASE 36 04/29/2017 0525     Medications: . heparin  5,000 Units Subcutaneous Q8H    Assessment/Plan SBO Prior abdominal surgery with RLQ scar - Previous surgery as a 1 day old child likely for pyloromyotomy. Bacterial vaginosis - treated Anemia FEN:  IV fluids/ full liquids ID:  Flagyl 10/5-10/9/18 - completed  DVT: Heparin     Plan:  Soft diet, she can get up and shower.  If she does well home soon.       LOS: 5 days     Lane Eland 05/05/2017 [phone]

## 2017-05-05 NOTE — Discharge Instructions (Signed)
Small Bowel Obstruction A small bowel obstruction means that something is blocking the small bowel. The small bowel is also called the small intestine. It is the long tube that connects the stomach to the colon. An obstruction will stop food and fluids from passing through the small bowel. Treatment depends on what is causing the problem and how bad the problem is. Follow these instructions at home:  Get a lot of rest.  Follow your diet as told by your doctor. You may need to: ? Only drink clear liquids until you start to get better. ? Avoid solid foods as told by your doctor.  Take over-the-counter and prescription medicines only as told by your doctor.  Keep all follow-up visits as told by your doctor. This is important. Contact a doctor if:  You have a fever.  You have chills. Get help right away if:  You have pain or cramps that get worse.  You throw up (vomit) blood.  You have a feeling of being sick to your stomach (nausea) that does not go away.  You cannot stop throwing up.  You cannot drink fluids.  You feel confused.  You feel dry or thirsty (dehydrated).  Your belly gets more bloated.  You feel weak or you pass out (faint). This information is not intended to replace advice given to you by your health care provider. Make sure you discuss any questions you have with your health care provider. Document Released: 08/20/2004 Document Revised: 03/09/2016 Document Reviewed: 09/06/2014 Elsevier Interactive Patient Education  2018 [employer].   Soft-Food Meal Plan - use for at least 1 more week A soft-food meal plan includes foods that are safe and easy to swallow. This meal plan typically is used:  If you are having trouble chewing or swallowing foods.  As a transition meal plan after only having had liquid meals for a long period.  What do I need to know about the soft-food meal plan? A soft-food meal plan includes tender foods that are soft and easy to chew  and swallow. In most cases, bite-sized pieces of food are easier to swallow. A bite-sized piece is about  inch or smaller. Foods in this plan do not need to be ground or pureed. Foods that are very hard, crunchy, or sticky should be avoided. Also, breads, cereals, yogurts, and desserts with nuts, seeds, or fruits should be avoided. What foods can I eat? Grains Rice and wild rice. Moist bread, dressing, pasta, and noodles. Well-moistened dry or cooked cereals, such as farina (cooked wheat cereal), oatmeal, or grits. Biscuits, breads, muffins, pancakes, and waffles that have been well moistened. Vegetables Shredded lettuce. Cooked, tender vegetables, including potatoes without skins. Vegetable juices. Broths or creamed soups made with vegetables that are not stringy or chewy. Strained tomatoes (without seeds). Fruits Canned or well-cooked fruits. Soft (ripe), peeled fresh fruits, such as peaches, nectarines, kiwi, cantaloupe, honeydew melon, and watermelon (without seeds). Soft berries with small seeds, such as strawberries. Fruit juices (without pulp). Meats and Other Protein Sources Moist, tender, lean beef. Mutton. Lamb. Veal. Chicken. [place]. Liver. Ham. Fish without bones. Eggs. Dairy Milk, milk drinks, and cream. Plain cream cheese and cottage cheese. Plain yogurt. Sweets/Desserts Flavored gelatin desserts. Custard. Plain ice cream, frozen yogurt, sherbet, milk shakes, and malts. Plain cakes and cookies. Plain hard candy. Other Butter, margarine (without trans fat), and cooking oils. Mayonnaise. Cream sauces. Mild spices, salt, and sugar. Syrup, molasses, honey, and jelly. The items listed above may not be a complete  list of recommended foods or beverages. Contact your dietitian for more options. What foods are not recommended? Grains Dry bread, toast, crackers that have not been moistened. Coarse or dry cereals, such as bran, granola, and shredded wheat. Tough or chewy crusty breads, such  as [place] bread or baguettes. Vegetables Corn. Raw vegetables except shredded lettuce. Cooked vegetables that are tough or stringy. Tough, crisp, fried potatoes and potato skins. Fruits Fresh fruits with skins or seeds or both, such as apples, pears, or grapes. Stringy, high-pulp fruits, such as papaya, pineapple, coconut, or mango. Fruit leather, fruit roll-ups, and all dried fruits. Meats and Other Protein Sources Sausages and hot dogs. Meats with gristle. Fish with bones. Nuts, seeds, and chunky peanut or other nut butters. Sweets/Desserts Cakes or cookies that are very dry or chewy. The items listed above may not be a complete list of foods and beverages to avoid. Contact your dietitian for more information. This information is not intended to replace advice given to you by your health care provider. Make sure you discuss any questions you have with your health care provider. Document Released: 10/20/2007 Document Revised: 12/19/2015 Document Reviewed: 06/09/2013 Elsevier Interactive Patient Education  2017 [employer].

## 2020-08-12 ENCOUNTER — Other Ambulatory Visit: Payer: Self-pay

## 2020-08-12 ENCOUNTER — Encounter (HOSPITAL_COMMUNITY): Payer: Self-pay

## 2020-08-12 ENCOUNTER — Emergency Department (HOSPITAL_COMMUNITY)
Admission: EM | Admit: 2020-08-12 | Discharge: 2020-08-13 | Disposition: A | Discharge status: Home / Self Care | Payer: Private Health Insurance - Indemnity | Attending: Emergency Medicine | Admitting: Emergency Medicine

## 2020-08-12 DIAGNOSIS — Z5321 Procedure and treatment not carried out due to patient leaving prior to being seen by health care provider: Secondary | ICD-10-CM | POA: Insufficient documentation

## 2020-08-12 DIAGNOSIS — R1030 Lower abdominal pain, unspecified: Secondary | ICD-10-CM | POA: Insufficient documentation

## 2020-08-12 DIAGNOSIS — R1110 Vomiting, unspecified: Secondary | ICD-10-CM | POA: Diagnosis not present

## 2020-08-12 LAB — COMPREHENSIVE METABOLIC PANEL
ALT: 15 U/L (ref 0–44)
AST: 21 U/L (ref 15–41)
Albumin: 4.1 g/dL (ref 3.5–5.0)
Alkaline Phosphatase: 50 U/L (ref 38–126)
Anion gap: 10 (ref 5–15)
BUN: 10 mg/dL (ref 6–20)
CO2: 26 mmol/L (ref 22–32)
Calcium: 9.5 mg/dL (ref 8.9–10.3)
Chloride: 102 mmol/L (ref 98–111)
Creatinine, Ser: 0.96 mg/dL (ref 0.44–1.00)
GFR, Estimated: >60 mL/min (ref >60)
Glucose, Bld: 116 mg/dL — ABNORMAL HIGH (ref 70–99)
Potassium: 3.4 mmol/L — ABNORMAL LOW (ref 3.5–5.1)
Sodium: 138 mmol/L (ref 135–145)
Total Bilirubin: 0.6 mg/dL (ref 0.3–1.2)
Total Protein: 7.2 g/dL (ref 6.5–8.1)

## 2020-08-12 LAB — CBC
HCT: 38.7 % (ref 36.0–46.0)
Hemoglobin: 12.6 g/dL (ref 12.0–15.0)
MCH: 25.9 pg — ABNORMAL LOW (ref 26.0–34.0)
MCHC: 32.6 g/dL (ref 30.0–36.0)
MCV: 79.6 fL — ABNORMAL LOW (ref 80.0–100.0)
Platelets: 514 10*3/uL — ABNORMAL HIGH (ref 150–400)
RBC: 4.86 MIL/uL (ref 3.87–5.11)
RDW: 13.9 % (ref 11.5–15.5)
WBC: 9.7 10*3/uL (ref 4.0–10.5)
nRBC: 0 % (ref 0.0–0.2)

## 2020-08-12 LAB — I-STAT BETA HCG BLOOD, ED (MC, WL, AP ONLY): I-stat hCG, quantitative: <5 m[IU]/mL (ref <5)

## 2020-08-12 LAB — LIPASE, BLOOD: Lipase: 24 U/L (ref 11–51)

## 2020-08-12 NOTE — ED Triage Notes (Signed)
Pt reports having lower abd pain that started this evening, vomited x 1, denies dysuria or abnormal discharge.

## 2020-08-13 ENCOUNTER — Ambulatory Visit (HOSPITAL_COMMUNITY)
Admission: EM | Admit: 2020-08-13 | Discharge: 2020-08-13 | Disposition: A | Discharge status: Home / Self Care | Payer: Private Health Insurance - Indemnity | Attending: Family Medicine | Admitting: Family Medicine

## 2020-08-13 ENCOUNTER — Ambulatory Visit (INDEPENDENT_AMBULATORY_CARE_PROVIDER_SITE_OTHER): Payer: Private Health Insurance - Indemnity

## 2020-08-13 ENCOUNTER — Encounter (HOSPITAL_COMMUNITY): Payer: Self-pay | Admitting: Emergency Medicine

## 2020-08-13 DIAGNOSIS — R1110 Vomiting, unspecified: Secondary | ICD-10-CM | POA: Diagnosis not present

## 2020-08-13 DIAGNOSIS — R109 Unspecified abdominal pain: Secondary | ICD-10-CM | POA: Diagnosis not present

## 2020-08-13 DIAGNOSIS — K2900 Acute gastritis without bleeding: Secondary | ICD-10-CM

## 2020-08-13 LAB — URINALYSIS, ROUTINE W REFLEX MICROSCOPIC
Bilirubin Urine: NEGATIVE
Glucose, UA: NEGATIVE mg/dL
Hgb urine dipstick: NEGATIVE
Ketones, ur: NEGATIVE mg/dL
Leukocytes,Ua: NEGATIVE
Nitrite: NEGATIVE
Protein, ur: NEGATIVE mg/dL
Specific Gravity, Urine: 1.008 (ref 1.005–1.030)
pH: 6 (ref 5.0–8.0)

## 2020-08-13 MED ORDER — ONDANSETRON HCL 4 MG PO TABS: 4.0000 mg | ORAL_TABLET | Freq: Three times a day (TID) | ORAL | 0 refills | Status: DC | PRN

## 2020-08-13 MED ORDER — ONDANSETRON 4 MG PO TBDP: 4.0000 mg | ORAL_TABLET | Freq: Three times a day (TID) | ORAL | 0 refills | Status: DC | PRN

## 2020-08-13 NOTE — Discharge Instructions (Addendum)
Your x-rays were negative for a small bowel obstruction.  If any of your symptoms worsen (if your vomit has blood in it, if you develop signs of infection such as fever, or if you cannot keep down liquids and are getting dehydrated) you should go to the emergency department.  I have prescribed you Zofran.  You can use Tylenol for pain and also use heat in the form of heating pads applied to the stomach for pain as well.

## 2020-08-13 NOTE — ED Notes (Signed)
Pt checked out AMA. 

## 2020-08-13 NOTE — ED Provider Notes (Signed)
[facility]    CSN: [id number] Arrival date & time: 08/13/20  1116      History   Chief Complaint Chief Complaint  Patient presents with  . Abdominal Pain    HPI Tamara Pugh is a 25 y.o. female.    Patient has a burning sensation in her stomach and vomiting that started yesterday around 3 PM. Starts below her umbilicus and goes up her epigastric region. She describes it as a sharp pain that waxes and wanes. It is worse with movement. Drinking or eating anything makes it worse. She has not eaten anything today. She took an Alka-Seltzer yesterday and vomited it up. Vomit is nonbloody, nonbilious. On Saturday night she had too much to drink and vomited that night, but was "fine" Sunday until she vomited again on Monday. She drank liquor Saturday night. She states she drinks "once in a blue moon". She is not taking daily NSAIDs. Has no diarrhea, no dysuria, no increased frequency with urination. In 2018 she was hospitalized for abdominal pain and vomiting. She states she also had a tube placed in her abdomen as a infant and has a scar from it, but no other surgeries. She is vaccinated against COVID and has not come into contact with anybody that has been COVID positive.     Past Medical History:  Diagnosis Date  . Hx SBO     Patient Active Problem List   Diagnosis Date Noted  . SBO (small bowel obstruction) (HCC) 04/30/2017  . BV (bacterial vaginosis) 04/30/2017    Past Surgical History:  Procedure Laterality Date  . feeding tube placement and removal      OB History   No obstetric history on file.      Home Medications    Prior to Admission medications   Medication Sig Start Date End Date Taking? Authorizing Provider  ondansetron (ZOFRAN ODT) 4 MG disintegrating tablet Take 1 tablet (4 mg total) by mouth every 8 (eight) hours as needed for nausea or vomiting. 08/13/20  Yes Sandre Kitty, MD  HYDROcodone-acetaminophen (NORCO/VICODIN) 5-325 MG tablet Take 2  tablets by mouth every 4 (four) hours as needed. 04/29/17   Lorre Nick, MD  methocarbamol (ROBAXIN-750) 750 MG tablet Take 1 tablet (750 mg total) by mouth 4 (four) times daily. 04/29/17   Lorre Nick, MD  naproxen (NAPROSYN) 500 MG tablet Take 1 tablet (500 mg total) by mouth 2 (two) times daily. 04/29/17   Lorre Nick, MD    Family History Family History  Family history unknown: Yes    Social History Social History   Tobacco Use  . Smoking status: Never Smoker  . Smokeless tobacco: Never Used  Substance Use Topics  . Alcohol use: No  . Drug use: No     Allergies   Patient has no known allergies.   Review of Systems Review of Systems  All other systems reviewed and are negative.    Physical Exam Triage Vital Signs ED Triage Vitals  Enc Vitals Group     BP 08/13/20 1150 124/81     Pulse Rate 08/13/20 1150 76     Resp 08/13/20 1150 18     Temp 08/13/20 1150 98 F (36.7 C)     Temp Source 08/13/20 1150 Oral     SpO2 08/13/20 1150 100 %     Weight --      Height --      Head Circumference --      Peak Flow --  Pain Score 08/13/20 1148 10     Pain Loc --      Pain Edu? --      Excl. in GC? --    No data found.  Updated Vital Signs BP 124/81 (BP Location: Right Arm)   Pulse 76   Temp 98 F (36.7 C) (Oral)   Resp 18   LMP 07/27/2020   SpO2 100%   Visual Acuity Right Eye Distance:   Left Eye Distance:   Bilateral Distance:    Right Eye Near:   Left Eye Near:    Bilateral Near:     Physical Exam Vitals reviewed.  Constitutional:      Appearance: She is normal weight.  HENT:     Head: Normocephalic.     Mouth/Throat:     Mouth: Mucous membranes are moist.     Pharynx: No pharyngeal swelling or oropharyngeal exudate.  Eyes:     Extraocular Movements: Extraocular movements intact.  Cardiovascular:     Rate and Rhythm: Normal rate and regular rhythm.     Heart sounds: Normal heart sounds. No murmur heard.   Pulmonary:     Effort:  Pulmonary effort is normal. No respiratory distress.     Breath sounds: No wheezing or rhonchi.  Abdominal:     General: Abdomen is flat. Bowel sounds are increased. There is no distension.     Tenderness: There is generalized abdominal tenderness. There is no right CVA tenderness or left CVA tenderness.  Skin:    General: Skin is warm and dry.  Neurological:     Mental Status: She is alert.      UC Treatments / Results  Labs (all labs ordered are listed, but only abnormal results are displayed) Labs Reviewed - No data to display  EKG   Radiology DG Abd Acute W/Chest  Result Date: 08/13/2020 CLINICAL DATA:  Abdominal pain since yesterday associated with vomiting., pain is in the lower abdomen as burning sensation that extends into the chest. Denies fever, diarrhea and cold symptoms EXAM: DG ABDOMEN ACUTE WITH 1 VIEW CHEST COMPARISON:  Abdominal radiograph May 01, 2017 and CT abdomen April 30, 2017. FINDINGS: There is no evidence of dilated bowel loops or free intraperitoneal air. No radiopaque calculi or other significant radiographic abnormality is seen. Heart size and mediastinal contours are within normal limits. Both lungs are clear. IMPRESSION: Negative abdominal radiographs.  No acute cardiopulmonary disease. Electronically Signed   By: Maudry Mayhew MD   On: 08/13/2020 13:14    Procedures Procedures (including critical care time)  Medications Ordered in UC Medications - No data to display  Initial Impression / Assessment and Plan / UC Course  I have reviewed the triage vital signs and the nursing notes.  Pertinent labs & imaging results that were available during my care of the patient were reviewed by me and considered in my medical decision making (see chart for details).     Patient presents with 1 day history of vomiting and diffuse abdominal pain. Has a history of SBO requiring hospitalization. Lab work obtained in the ED last night for lipase and LFTs,  therefore unlikely to be pancreatitis in the setting of her recent excessive alcohol use. We will get imaging of the abdomen today.  Abdominal imaging was negative.  Given this and her reassuring lab work from last night we can rule out underlying infection, UTI, pregnancy, pancreatitis.  This may represent a partial obstruction that was not able to be picked up on  x-ray but patient okay to discharge home with her ability to tolerate liquids p.o.  Advised patient to return to ED if she develops any signs of infection, dehydration, or bleeding.  We will send patient home with Zofran and advised her to advance diet slowly.  Use Tylenol and applied heat for pain relief.  Final Clinical Impressions(s) / UC Diagnoses   Final diagnoses:  Acute gastritis without hemorrhage, unspecified gastritis type  Other acute gastritis without hemorrhage     Discharge Instructions     Your x-rays were negative for a small bowel obstruction.  If any of your symptoms worsen (if your vomit has blood in it, if you develop signs of infection such as fever, or if you cannot keep down liquids and are getting dehydrated) you should go to the emergency department.  I have prescribed you Zofran.  You can use Tylenol for pain and also use heat in the form of heating pads applied to the stomach for pain as well.      ED Prescriptions    Medication Sig Dispense Auth. Provider   ondansetron (ZOFRAN) 4 MG tablet  (Status: Discontinued) Take 1 tablet (4 mg total) by mouth every 8 (eight) hours as needed for nausea or vomiting. 20 tablet Sandre Kitty, MD   ondansetron (ZOFRAN ODT) 4 MG disintegrating tablet Take 1 tablet (4 mg total) by mouth every 8 (eight) hours as needed for nausea or vomiting. 20 tablet Sandre Kitty, MD     PDMP not reviewed this encounter.   Sandre Kitty, MD 08/13/20 [phone]

## 2020-08-13 NOTE — ED Triage Notes (Signed)
Pt here for abd pain since yesterday associated w/vomiting d/t taking alka seltzer... reports pain is in her lower abd and has a burning sensation that goes up her chest  Denies fevers, diarrhea, cold sx  A&O x4.. NAD.Marland Kitchen. ambulatory

## 2020-08-14 ENCOUNTER — Ambulatory Visit (HOSPITAL_COMMUNITY): Payer: Self-pay

## 2020-09-06 ENCOUNTER — Ambulatory Visit (INDEPENDENT_AMBULATORY_CARE_PROVIDER_SITE_OTHER): Payer: Private Health Insurance - Indemnity | Admitting: Family

## 2020-09-06 ENCOUNTER — Other Ambulatory Visit: Payer: Self-pay

## 2020-09-06 ENCOUNTER — Encounter: Payer: Self-pay | Admitting: Family

## 2020-09-06 VITALS — BP 115/80 | HR 83 | Ht 64.37 in | Wt 141.6 lb

## 2020-09-06 DIAGNOSIS — Z7689 Persons encountering health services in other specified circumstances: Secondary | ICD-10-CM | POA: Diagnosis not present

## 2020-09-06 DIAGNOSIS — R109 Unspecified abdominal pain: Secondary | ICD-10-CM

## 2020-09-06 DIAGNOSIS — Z711 Person with feared health complaint in whom no diagnosis is made: Secondary | ICD-10-CM | POA: Diagnosis not present

## 2020-09-06 NOTE — Progress Notes (Signed)
Establish care  Bruising on left breast area 6 months ago Fhx breast cancer maternal grandmother Referral to GI

## 2020-09-06 NOTE — Progress Notes (Signed)
Subjective:    Tamara Pugh - 25 y.o. female MRN [id number]  Date of birth: Jan 05, 1996  HPI  Tamara Pugh is to establish care. Patient has a PMH significant for small bowel obstruction and bacterial vaginosis.    Current issues and/or concerns: 1. BREAST CONCERN: Reports concern of bruising on left breast for at least 6 months. Does have concern related to family history of breast cancer in maternal grandmother. Would like to have this evaluated further. Duration :months Location: left Redness: no Swelling: no Nipple discharge: no Breast lump: no  2. GI REFERRAL REQUEST: 08/13/2020 at [facility] per MD note: Patient presents with 1 day history of vomiting and diffuse abdominal pain. Has a history of SBO requiring hospitalization. Lab work obtained in the ED last night for lipase and LFTs, therefore unlikely to be pancreatitis in the setting of her recent excessive alcohol use. We will get imaging of the abdomen today.  Abdominal imaging was negative.  Given this and her reassuring lab work from last night we can rule out underlying infection, UTI, pregnancy, pancreatitis.  This may represent a partial obstruction that was not able to be picked up on x-ray but patient okay to discharge home with her ability to tolerate liquids p.o.  Advised patient to return to ED if she develops any signs of infection, dehydration, or bleeding.  We will send patient home with Zofran and advised her to advance diet slowly.  Use Tylenol and applied heat for pain relief.  09/06/2020: Reports feeling better since visit at Urgent Care. However, still having some stomach discomfort and would like to have this further evaluated. Had stomach surgery as an infant and history of small bowel obstruction. Wants to make sure everything is ok.   ROS per HPI   Health Maintenance:  Health Maintenance Due  Topic Date Due  . Hepatitis C Screening  Never done  . TETANUS/TDAP  Never done  .  PAP-Cervical Cytology Screening  Never done  . PAP SMEAR-Modifier  Never done  . INFLUENZA VACCINE  Never done    Past Medical History: Patient Active Problem List   Diagnosis Date Noted  . SBO (small bowel obstruction) (HCC) 04/30/2017  . BV (bacterial vaginosis) 04/30/2017    Social History   reports that she has never smoked. She has never used smokeless tobacco. She reports current alcohol use of about 2.0 standard drinks of alcohol per week. She reports that she does not use drugs.   Family History  family history includes Breast cancer in her maternal grandmother; Healthy in her father and mother.   Medications: reviewed and updated   Objective:   Physical Exam BP 115/80 (BP Location: Left Arm, Patient Position: Sitting)   Pulse 83   Ht 5' 4.37" (1.635 m)   Wt 141 lb 9.6 oz (64.2 kg)   SpO2 98%   BMI 24.03 kg/m  Physical Exam HENT:     Head: Normocephalic and atraumatic.  Eyes:     Extraocular Movements: Extraocular movements intact.     Pupils: Pupils are equal, round, and reactive to light.  Cardiovascular:     Rate and Rhythm: Normal rate and regular rhythm.     Pulses: Normal pulses.     Heart sounds: Normal heart sounds.  Pulmonary:     Effort: Pulmonary effort is normal.     Breath sounds: Normal breath sounds.  Musculoskeletal:     Cervical back: Normal range of motion and neck supple.  Neurological:  General: No focal deficit present.     Mental Status: She is alert and oriented to person, place, and time.  Psychiatric:        Mood and Affect: Mood normal.        Behavior: Behavior normal.        Assessment & Plan:  1. Encounter to establish care: - Patient presents today to establish care.  - Return for annual physical examination, labs, and health maintenance. Arrive fasting meaning having had no food and/or nothing to drink for at least 8 hours prior to appointment.  2. Concern about breast cancer in female without diagnosis: - Reports  concern of bruising on left breast for at least 6 months. Does have concern related to family history of breast cancer in maternal grandmother. Would like to have this evaluated further. - Ultrasound of left breast. Will call with results.  - US BREAST COMPLETE UNI LEFT INC AXILLA; Future  3. Stomach discomfort: - Reports feeling better since visit at Urgent Care on 08/13/2020. However, still having some stomach discomfort and would like to have this further evaluated. Had stomach surgery as an infant and history of small bowel obstruction. Wants to make sure everything is ok.  - Diagnostic x-ray abdominal acute with chest last obtained 08/13/2020 negative.  - Per patient request referral to Gastroenterology for further evaluation and management.  - Ambulatory referral to Gastroenterology  Patient was given clear instructions to go to Emergency Department or return to medical center if symptoms don't improve, worsen, or new problems develop.The patient verbalized understanding.   Ricky Stabs, NP 09/08/2020, 11:16 AM Primary Care at [facility]

## 2020-09-06 NOTE — Patient Instructions (Signed)
Return for annual physical examination, labs, and health maintenance. Arrive fasting meaning having had no food and/or nothing to drink for at least 8 hours prior to appointment.  Referral to Gastroenterology.   Referral for Tamara ultrasound.  Thank you for choosing Primary Care at [facility] for your medical home!    Tamara Pugh was seen by Tamara Herter, NP today.   Tamara Pugh's primary care provider is Tamara Lehr Zachery Dauer, NP.   For the best care possible,  you should try to see Tamara Fruits, NP whenever you come to clinic.   We look forward to seeing you again soon!  If you have any questions about your visit today,  please call Tamara Pugh at [phone]  Or feel free to reach your provider via [name].     Tamara Pugh, Female  Tamara Pugh is a malignant growth of tissue (tumor) in the Tamara. Unlike noncancerous (benign) tumors, malignant tumors are cancerous and can spread to other parts of the body. The two most common types of Tamara Pugh start in the milk ducts (ductal carcinoma) or in the lobules where milk is made in the Tamara (lobular carcinoma). Tamara Pugh is one of the most common types of Pugh in women. What are the causes? The exact cause of female Tamara Pugh is unknown. What increases the risk? The following factors may make you more likely to develop this condition:  Being older than 27 years of age.  Race and ethnicity. Caucasian women generally have an increased risk, but African-American women are more likely to develop the disease before age 18.  Having a family history of Tamara Pugh.  Having had Tamara Pugh in the past.  Having certain noncancerous conditions of the Tamara, such as dense Tamara tissue.  Having the BRCA1 and BRCA2 genes.  Having a history of radiation exposure.  Obesity.  Starting menopause after age 70.  Starting your menstrual periods before age 65.  Having never been pregnant or having your first child after  age 72.  Having never breastfed.  Using hormone therapy after menopause.  Using birth control pills.  Drinking more than one alcoholic drink a day.  Exposure to the drug DES, which was given to pregnant women from the 1940s to the 1970s. What are the signs or symptoms? Symptoms of this condition include:  A painless lump or thickening in your Tamara.  Changes in the size or shape of your Tamara.  Tamara skin changes, such as puckering or dimpling.  Nipple abnormalities, such as scaling, crustiness, redness, or pulling in (retraction).  Nipple discharge that is bloody or clear.   How is this diagnosed? This condition may be diagnosed by:  Taking your medical history and doing a physical exam. During the exam, your health care provider will feel the tissue around your Tamara and under your arms.  Taking a sample of nipple discharge. The sample will be examined under a microscope.  Performing imaging tests, such as Tamara X-rays (mammogram), Tamara ultrasound exams, or an MRI.  Taking a tissue sample (biopsy) from the Tamara. The sample will be examined under a microscope to look for Pugh cells.  Taking a sample from the lymph nodes near the affected Tamara (sentinel node biopsy). Your Pugh will be staged to determine its severity and extent. Staging is a careful attempt to find out the size of the tumor, whether the Pugh has spread, and if so, to what parts of the body. Staging also includes testing your tumor for  certain receptors, such as estrogen, progesterone, and human epidermal growth factor receptor 2 (HER2). This will help your Pugh care team decide on a treatment that will work best for you. You may need to have more tests to determine the stage of your Pugh. Stages include the following:  Stage 0--The tumor has not spread to other Tamara tissue.  Stage I--The Pugh is only found in the Tamara or may be in the lymph nodes. The tumor may be up to  in (2 cm)  wide.  Stage II--The Pugh has spread to nearby lymph nodes. The tumor may be up to 2 in (5 cm) wide.  Stage III--The Pugh has spread to more distant lymph nodes. The tumor may be larger than 2 in (5 cm) wide.  Stage IV--The Pugh has spread to other parts of the body, such as the bones, brain, liver, or lungs.   How is this treated? Treatment for this condition depends on the type and stage of the Tamara Pugh. It may be treated with:  Surgery. This may involve Tamara-conserving surgery (lumpectomy or partial mastectomy) in which only the part of the Tamara containing the Pugh is removed. Some normal tissue surrounding this area may also be removed. In some cases, surgery may be done to remove the entire Tamara (mastectomy) and nipple. Lymph nodes may also be removed.  Radiation therapy, which uses high-energy rays to kill Pugh cells.  Chemotherapy, which is the use of drugs to kill Pugh cells.  Hormone therapy, which involves taking medicine to adjust the hormone levels in your body. You may take medicine to decrease your estrogen levels. This can help stop Pugh cells from growing.  Targeted therapy, in which drugs are used to block the growth and spread of Pugh cells. These drugs target a specific part of the Pugh cell and usually cause fewer side effects than chemotherapy. Targeted therapy may be used alone or in combination with chemotherapy.  A combination of surgery, radiation, chemotherapy, or hormone therapy may be needed to treat Tamara Pugh. Follow these instructions at home:  Take over-the-counter and prescription medicines only as told by your health care provider.  Eat a healthy diet. A healthy diet includes lots of Pugh and vegetables, low-fat dairy products, lean meats, and fiber. ? Make sure half your plate is filled with Pugh or vegetables. ? Choose high-fiber foods such as whole-grain breads and cereals.  Consider joining a support group. This  may help you learn to cope with the stress of having Tamara Pugh.  Talk to your health care team about exercise and physical activity. The right exercise program can: ? Help prevent or reduce symptoms such as fatigue or depression. ? Improve overall health and survival rates.  Keep all follow-up visits as told by your health care provider. This is important. Where to find more information  American Pugh Society: www.Pugh.[place]: www.Pugh.gov Contact a health care provider if:  You have a sudden increase in pain.  You have any symptoms or changes that concern you.  You lose weight without trying.  You notice a new lump in either Tamara or under your arm.  You develop swelling in either arm or hand.  You have a fever.  You notice new fatigue or weakness. Get help right away if:  You have chest pain or trouble breathing.  You faint. Summary  Tamara Pugh is a malignant growth of tissue (tumor) in the Tamara.  Your Pugh will be staged to determine  its severity and extent.  Treatment for this condition depends on the type and stage of the Tamara Pugh. This information is not intended to replace advice given to you by your health care provider. Make sure you discuss any questions you have with your health care provider. Document Revised: 06/25/2017 Document Reviewed: 03/08/2017 Elsevier Patient Education  2021 [employer].

## 2020-10-18 ENCOUNTER — Ambulatory Visit
Admission: RE | Admit: 2020-10-18 | Discharge: 2020-10-18 | Disposition: A | Discharge status: Home / Self Care | Payer: Private Health Insurance - Indemnity | Source: Ambulatory Visit | Attending: Family | Admitting: Family

## 2020-10-18 ENCOUNTER — Other Ambulatory Visit: Payer: Self-pay

## 2020-10-18 DIAGNOSIS — Z711 Person with feared health complaint in whom no diagnosis is made: Secondary | ICD-10-CM

## 2020-10-21 ENCOUNTER — Ambulatory Visit (INDEPENDENT_AMBULATORY_CARE_PROVIDER_SITE_OTHER): Payer: Private Health Insurance - Indemnity | Admitting: Family

## 2020-10-21 ENCOUNTER — Other Ambulatory Visit: Payer: Self-pay

## 2020-10-21 ENCOUNTER — Encounter: Payer: Self-pay | Admitting: Family

## 2020-10-21 VITALS — BP 108/74 | HR 68 | Ht 64.37 in | Wt 141.6 lb

## 2020-10-21 DIAGNOSIS — Z131 Encounter for screening for diabetes mellitus: Secondary | ICD-10-CM | POA: Diagnosis not present

## 2020-10-21 DIAGNOSIS — Z1159 Encounter for screening for other viral diseases: Secondary | ICD-10-CM

## 2020-10-21 DIAGNOSIS — Z0000 Encounter for general adult medical examination without abnormal findings: Secondary | ICD-10-CM | POA: Diagnosis not present

## 2020-10-21 DIAGNOSIS — D509 Iron deficiency anemia, unspecified: Secondary | ICD-10-CM

## 2020-10-21 DIAGNOSIS — Z13228 Encounter for screening for other metabolic disorders: Secondary | ICD-10-CM | POA: Diagnosis not present

## 2020-10-21 DIAGNOSIS — Z1329 Encounter for screening for other suspected endocrine disorder: Secondary | ICD-10-CM

## 2020-10-21 DIAGNOSIS — Z13220 Encounter for screening for lipoid disorders: Secondary | ICD-10-CM

## 2020-10-21 DIAGNOSIS — Z124 Encounter for screening for malignant neoplasm of cervix: Secondary | ICD-10-CM

## 2020-10-21 DIAGNOSIS — E785 Hyperlipidemia, unspecified: Secondary | ICD-10-CM

## 2020-10-21 DIAGNOSIS — Z130 Encounter for screening for diseases of the blood and blood-forming organs and certain disorders involving the immune mechanism: Secondary | ICD-10-CM | POA: Diagnosis not present

## 2020-10-21 NOTE — Progress Notes (Signed)
Patient ID: Tamara Pugh, female    DOB: 1996-07-03  MRN: [id number]  CC: Annual Physical Exam  Subjective: Tamara Pugh is a 25 y.o. female who presents for annual physical exam. Her concerns today include: None. Has appointment scheduled with Gastroenterology for stomach discomfort in the next two weeks.  Patient Active Problem List   Diagnosis Date Noted  . SBO (small bowel obstruction) (HCC) 04/30/2017  . BV (bacterial vaginosis) 04/30/2017     No current outpatient medications on file prior to visit.   No current facility-administered medications on file prior to visit.    No Known Allergies  Social History   Socioeconomic History  . Marital status: Single    Spouse name: Not on file  . Number of children: Not on file  . Years of education: Not on file  . Highest education level: Not on file  Occupational History  . Not on file  Tobacco Use  . Smoking status: Never Smoker  . Smokeless tobacco: Never Used  Vaping Use  . Vaping Use: Never used  Substance and Sexual Activity  . Alcohol use: Yes    Alcohol/week: 2.0 standard drinks    Types: 2 Standard drinks or equivalent per week  . Drug use: No  . Sexual activity: Yes  Other Topics Concern  . Not on file  Social History Narrative  . Not on file   Social Determinants of Health   Financial Resource Strain: Not on file  Food Insecurity: Not on file  Transportation Needs: Not on file  Physical Activity: Not on file  Stress: Not on file  Social Connections: Not on file  Intimate Partner Violence: Not on file    Family History  Problem Relation Age of Onset  . Healthy Mother   . Healthy Father   . Breast cancer Maternal Grandmother     Past Surgical History:  Procedure Laterality Date  . feeding tube placement and removal      ROS: Review of Systems Negative except as stated above  PHYSICAL EXAM: BP 108/74 (BP Location: Left Arm, Patient Position: Sitting)   Pulse 68   Ht 5' 4.37" (1.635 m)    Wt 141 lb 9.6 oz (64.2 kg)   SpO2 99%   BMI 24.03 kg/m   Wt Readings from Last 3 Encounters:  10/21/20 141 lb 9.6 oz (64.2 kg)  09/06/20 141 lb 9.6 oz (64.2 kg)  04/30/17 135 lb (61.2 kg)   Physical Exam Exam conducted with a chaperone present.  Constitutional:      Appearance: She is normal weight.  HENT:     Head: Normocephalic and atraumatic.     Right Ear: Tympanic membrane, ear canal and external ear normal.     Left Ear: Tympanic membrane, ear canal and external ear normal.     Nose: Nose normal.     Mouth/Throat:     Mouth: Mucous membranes are moist.     Pharynx: Oropharynx is clear.  Eyes:     Extraocular Movements: Extraocular movements intact.     Conjunctiva/sclera: Conjunctivae normal.     Pupils: Pupils are equal, round, and reactive to light.  Cardiovascular:     Rate and Rhythm: Normal rate and regular rhythm.     Pulses: Normal pulses.     Heart sounds: Normal heart sounds.  Pulmonary:     Effort: Pulmonary effort is normal.     Breath sounds: Normal breath sounds.  Chest:  Breasts:     Right: Normal.  Left: Normal.      Comments: Margorie John, CMA present during examination.  Abdominal:     General: Abdomen is flat. Bowel sounds are normal.     Palpations: Abdomen is soft.  Genitourinary:    Comments: Patient declined examination. Musculoskeletal:        General: Normal range of motion.     Cervical back: Normal range of motion and neck supple.  Skin:    General: Skin is warm and dry.     Capillary Refill: Capillary refill takes less than 2 seconds.  Neurological:     General: No focal deficit present.     Mental Status: She is alert and oriented to person, place, and time.  Psychiatric:        Mood and Affect: Mood normal.        Behavior: Behavior normal.     ASSESSMENT AND PLAN: 1. Annual physical exam: - Counseled on 150 minutes of exercise per week as tolerated, healthy eating (including decreased daily intake of saturated  fats, cholesterol, added sugars, sodium), STI prevention, and routine healthcare maintenance.  2. Screening for metabolic disorder: - Last CMP obtained 08/12/2020.  3. Screening for deficiency anemia: - CBC and iron panel to screen for anemia. - CBC - Iron, TIBC and Ferritin Panel  4. Diabetes mellitus screening: - Hemoglobin A1c to screen for pre-diabetes/diabetes. - Hemoglobin A1c  5. Screening cholesterol level: - Lipid panel to screen for high cholesterol.  - Lipid panel  6. Thyroid disorder screen: - TSH to check thyroid function.  - TSH  7. Need for hepatitis C screening test: - Hepatitis C antibody to screen for hepatitis C.  - Hepatitis C Antibody  8. Pap smear for cervical cancer screening: - Referral to Gynecology for cervical cancer screening by PAP smear.  - Ambulatory referral to Gynecology   Patient was given the opportunity to ask questions.  Patient verbalized understanding of the plan and was able to repeat key elements of the plan. Patient was given clear instructions to go to Emergency Department or return to medical center if symptoms don't improve, worsen, or new problems develop.The patient verbalized understanding.   Orders Placed This Encounter  Procedures  . Hepatitis C Antibody  . CBC  . Lipid panel  . TSH  . Hemoglobin A1c  . Iron, TIBC and Ferritin Panel  . Ambulatory referral to Gynecology     Requested Prescriptions    No prescriptions requested or ordered in this encounter    Follow-up with primary provider as scheduled.   Rema Fendt, NP

## 2020-10-21 NOTE — Patient Instructions (Signed)
Annual physical exam and labs today.  Preventive Care 74-27 Years Old, Female Preventive care refers to lifestyle choices and visits with your health care provider that can promote health and wellness. This includes:  A yearly physical exam. This is also called an annual wellness visit.  Regular dental and eye exams.  Immunizations.  Screening for certain conditions.  Healthy lifestyle choices, such as: ? Eating a healthy diet. ? Getting regular exercise. ? Not using drugs or products that contain nicotine and tobacco. ? Limiting alcohol use. What can I expect for my preventive care visit? Physical exam Your health care provider may check your:  Height and weight. These may be used to calculate your BMI (body mass index). BMI is a measurement that tells if you are at a healthy weight.  Heart rate and blood pressure.  Body temperature.  Skin for abnormal spots. Counseling Your health care provider may ask you questions about your:  Past medical problems.  Family's medical history.  Alcohol, tobacco, and drug use.  Emotional well-being.  Home life and relationship well-being.  Sexual activity.  Diet, exercise, and sleep habits.  Work and work Statistician.  Access to firearms.  Method of birth control.  Menstrual cycle.  Pregnancy history. What immunizations do I need? Vaccines are usually given at various ages, according to a schedule. Your health care provider will recommend vaccines for you based on your age, medical history, and lifestyle or other factors, such as travel or where you work.   What tests do I need? Blood tests  Lipid and cholesterol levels. These may be checked every 5 years starting at age 73.  Hepatitis C test.  Hepatitis B test. Screening  Diabetes screening. This is done by checking your blood sugar (glucose) after you have not eaten for a while (fasting).  STD (sexually transmitted disease) testing, if you are at  risk.  BRCA-related cancer screening. This may be done if you have a family history of breast, ovarian, tubal, or peritoneal cancers.  Pelvic exam and Pap test. This may be done every 3 years starting at age 27. Starting at age 48, this may be done every 5 years if you have a Pap test in combination with an HPV test. Talk with your health care provider about your test results, treatment options, and if necessary, the need for more tests.   Follow these instructions at home: Eating and drinking  Eat a healthy diet that includes fresh fruits and vegetables, whole grains, lean protein, and low-fat dairy products.  Take vitamin and mineral supplements as recommended by your health care provider.  Do not drink alcohol if: ? Your health care provider tells you not to drink. ? You are pregnant, may be pregnant, or are planning to become pregnant.  If you drink alcohol: ? Limit how much you have to 0-1 drink a day. ? Be aware of how much alcohol is in your drink. In the [place] one drink equals one 12 oz bottle of beer (355 mL), one 5 oz glass of wine (148 mL), or one 1 oz glass of hard liquor (44 mL).   Lifestyle  Take daily care of your teeth and gums. Brush your teeth every morning and night with fluoride toothpaste. Floss one time each day.  Stay active. Exercise for at least 30 minutes 5 or more days each week.  Do not use any products that contain nicotine or tobacco, such as cigarettes, e-cigarettes, and chewing tobacco. If you need help quitting,  ask your health care provider.  Do not use drugs.  If you are sexually active, practice safe sex. Use a condom or other form of protection to prevent STIs (sexually transmitted infections).  If you do not wish to become pregnant, use a form of birth control. If you plan to become pregnant, see your health care provider for a prepregnancy visit.  Find healthy ways to cope with stress, such as: ? Meditation, yoga, or listening to  music. ? Journaling. ? Talking to a trusted person. ? Spending time with friends and family. Safety  Always wear your seat belt while driving or riding in a vehicle.  Do not drive: ? If you have been drinking alcohol. Do not ride with someone who has been drinking. ? When you are tired or distracted. ? While texting.  Wear a helmet and other protective equipment during sports activities.  If you have firearms in your house, make sure you follow all gun safety procedures.  Seek help if you have been physically or sexually abused. What's next?  Go to your health care provider once a year for an annual wellness visit.  Ask your health care provider how often you should have your eyes and teeth checked.  Stay up to date on all vaccines. This information is not intended to replace advice given to you by your health care provider. Make sure you discuss any questions you have with your health care provider. Document Revised: 03/10/2020 Document Reviewed: 03/24/2018 Elsevier Patient Education  2021 [employer].

## 2020-10-21 NOTE — Progress Notes (Signed)
Physical

## 2020-10-22 DIAGNOSIS — E785 Hyperlipidemia, unspecified: Secondary | ICD-10-CM | POA: Insufficient documentation

## 2020-10-22 DIAGNOSIS — D509 Iron deficiency anemia, unspecified: Secondary | ICD-10-CM | POA: Insufficient documentation

## 2020-10-22 LAB — TSH: TSH: 1.71 u[IU]/mL (ref 0.450–4.500)

## 2020-10-22 LAB — IRON,TIBC AND FERRITIN PANEL
Ferritin: 14 ng/mL — ABNORMAL LOW (ref 15–150)
Iron Saturation: 9 % — CL (ref 15–55)
Iron: 38 ug/dL (ref 27–159)
Total Iron Binding Capacity: 424 ug/dL (ref 250–450)
UIBC: 386 ug/dL (ref 131–425)

## 2020-10-22 LAB — CBC
Hematocrit: 40.3 % (ref 34.0–46.6)
Hemoglobin: 12.3 g/dL (ref 11.1–15.9)
MCH: 24.8 pg — ABNORMAL LOW (ref 26.6–33.0)
MCHC: 30.5 g/dL — ABNORMAL LOW (ref 31.5–35.7)
MCV: 81 fL (ref 79–97)
Platelets: 474 10*3/uL — ABNORMAL HIGH (ref 150–450)
RBC: 4.95 x10E6/uL (ref 3.77–5.28)
RDW: 13.7 % (ref 11.7–15.4)
WBC: 6.4 10*3/uL (ref 3.4–10.8)

## 2020-10-22 LAB — HEPATITIS C ANTIBODY: Hep C Virus Ab: <0.1 s/co ratio (ref 0.0–0.9)

## 2020-10-22 LAB — LIPID PANEL
Chol/HDL Ratio: 4.6 ratio — ABNORMAL HIGH (ref 0.0–4.4)
Cholesterol, Total: 279 mg/dL — ABNORMAL HIGH (ref 100–199)
HDL: 61 mg/dL (ref >39)
LDL Chol Calc (NIH): 208 mg/dL — ABNORMAL HIGH (ref 0–99)
Triglycerides: 66 mg/dL (ref 0–149)
VLDL Cholesterol Cal: 10 mg/dL (ref 5–40)

## 2020-10-22 LAB — HEMOGLOBIN A1C
Est. average glucose Bld gHb Est-mCnc: 111 mg/dL
Hgb A1c MFr Bld: 5.5 % (ref 4.8–5.6)

## 2020-10-22 MED ORDER — ATORVASTATIN CALCIUM 10 MG PO TABS: 10.0000 mg | ORAL_TABLET | Freq: Every day | ORAL | 0 refills | Status: DC

## 2020-10-22 MED ORDER — FERUMOXYTOL INJECTION 510 MG/17 ML
510.0000 mg | Freq: Once | INTRAVENOUS | 1 refills | Status: DC
Start: 2020-10-22 — End: 2020-10-24

## 2020-10-22 NOTE — Addendum Note (Signed)
Addended by: Rema Fendt on: 10/22/2020 08:58 AM   Modules accepted: Orders

## 2020-10-22 NOTE — Addendum Note (Signed)
Addended by: Rema Fendt on: 10/22/2020 09:00 AM   Modules accepted: Orders

## 2020-10-22 NOTE — Progress Notes (Signed)
Thyroid normal.   No diabetes.   Hepatitis C negative.   Cholesterol higher than expected. High cholesterol may increase risk of heart attack and/or stroke. Consider eating more fruits, vegetables, and lean baked meats such as chicken or fish. Moderate intensity exercise at least 150 minutes as tolerated per week may help as well.    Atorvastatin prescribed for high cholesterol. Patient encouraged to have cholesterol rechecked within 6 weeks or sooner if needed.   Iron infusions prescribed for anemia deficiency. Nurse will call to schedule appointments. Patient encouraged to have rechecked at least 1 month after completion of second iron infusion.

## 2020-10-24 ENCOUNTER — Other Ambulatory Visit: Payer: Self-pay

## 2020-10-24 DIAGNOSIS — D509 Iron deficiency anemia, unspecified: Secondary | ICD-10-CM

## 2020-10-24 MED ORDER — SODIUM CHLORIDE 0.9 % IV SOLN: 510.0000 mg | Freq: Once | INTRAVENOUS | 0 refills | Status: AC

## 2020-10-29 ENCOUNTER — Encounter (HOSPITAL_COMMUNITY): Payer: Private Health Insurance - Indemnity

## 2020-10-31 ENCOUNTER — Ambulatory Visit: Payer: Private Health Insurance - Indemnity | Admitting: Physician Assistant

## 2020-11-28 ENCOUNTER — Other Ambulatory Visit (INDEPENDENT_AMBULATORY_CARE_PROVIDER_SITE_OTHER): Payer: 59

## 2020-11-28 ENCOUNTER — Encounter: Payer: Self-pay | Admitting: Physician Assistant

## 2020-11-28 ENCOUNTER — Ambulatory Visit (INDEPENDENT_AMBULATORY_CARE_PROVIDER_SITE_OTHER): Payer: 59 | Admitting: Physician Assistant

## 2020-11-28 VITALS — BP 100/76 | HR 97 | Ht 64.0 in | Wt 143.1 lb

## 2020-11-28 DIAGNOSIS — K5900 Constipation, unspecified: Secondary | ICD-10-CM

## 2020-11-28 DIAGNOSIS — D509 Iron deficiency anemia, unspecified: Secondary | ICD-10-CM

## 2020-11-28 DIAGNOSIS — R109 Unspecified abdominal pain: Secondary | ICD-10-CM

## 2020-11-28 LAB — SEDIMENTATION RATE: Sed Rate: 27 mm/hr — ABNORMAL HIGH (ref 0–20)

## 2020-11-28 MED ORDER — IRON 325 (65 FE) MG PO TABS: 1.0000 | ORAL_TABLET | Freq: Two times a day (BID) | ORAL | 3 refills | Status: AC

## 2020-11-28 NOTE — Patient Instructions (Addendum)
If you are age 27 or younger, your body mass index should be between 19-25. Your Body mass index is 24.57 kg/m. If this is out of the aformentioned range listed, please consider follow up with your Primary Care Provider.   Your provider has requested that you go to the basement level for lab work before leaving today. Press "B" on the elevator. The lab is located at the first door on the left as you exit the elevator.  START Benefiber 1-2 scoops daily with water. START Miralax 1 capful in 8 ounces of water or juice as needed START Iron 325 mg 1 tablet twice daily with food.  Increase the fiber in your diet- eat more vegetables  Drink 60 ounces of water daily.  Call the office in a few weeks to schedule a 2-3 month follow up.  Thank you for entrusting me with your care and choosing [facility].  Amy Esterwood, PA-C

## 2020-11-29 ENCOUNTER — Other Ambulatory Visit (INDEPENDENT_AMBULATORY_CARE_PROVIDER_SITE_OTHER): Payer: 59

## 2020-11-29 DIAGNOSIS — K5900 Constipation, unspecified: Secondary | ICD-10-CM | POA: Diagnosis not present

## 2020-11-29 DIAGNOSIS — R109 Unspecified abdominal pain: Secondary | ICD-10-CM

## 2020-11-29 DIAGNOSIS — D509 Iron deficiency anemia, unspecified: Secondary | ICD-10-CM | POA: Diagnosis not present

## 2020-11-29 LAB — TISSUE TRANSGLUTAMINASE, IGA: (tTG) Ab, IgA: <1 U/mL

## 2020-11-29 LAB — IGA: Immunoglobulin A: 167 mg/dL (ref 47–310)

## 2020-11-29 LAB — FECAL OCCULT BLOOD, IMMUNOCHEMICAL: Fecal Occult Bld: NEGATIVE

## 2020-12-01 ENCOUNTER — Encounter: Payer: Self-pay | Admitting: Physician Assistant

## 2020-12-01 NOTE — Progress Notes (Signed)
Subjective:    Patient ID: Tamara Pugh, female    DOB: April 13, 1996, 25 y.o.   MRN: [id number]  HPI Varsha is a pleasant 25 year old African-American female, new to GI today referred by Ricky Stabs  NP after recent episode of abdominal pain. Patient has history of prior small bowel obstruction, and prior history of iron deficiency anemia as well as hyperlipidemia. She relates that she had admission in October 2018 for small bowel obstruction.  Per notes in epic patient has history of a pyloromyotomy as a child which she believes was done at 47 year of age.  Her obstruction in 2018 was felt secondary to adhesions and resolved with supportive management. Patient had an ER visit in January 2022 with acute abdominal pain and imaging with plain abdominal films was done which was negative. Patient says that this episode felt very similar to her prior episode of obstruction, and that the pain was located in her right side, radiating into the epigastrium and chest.  She said it was very painful and that she felt bad for 3 to 4 days and then it gradually resolved.  She did feel at that time that the pain was worse postprandially.  She was on liquids for a few days.  She had no associated fever or chills, no diarrhea melena or hematochezia. Since that time she has been feeling well, appetite has been fine though she says she does not eat as much as she used to.  She does not feel that her bowel movements are as regular as they should be and usually is having a bowel movement every other day but not necessarily evacuating well. Regarding the history of iron deficiency she says she had not been anemic in the past to her knowledge.  She does have menstrual periods that last for about 7 days, no change over the past few years and not extremely heavy.  No family history of thalassemia. Labs from March 2022 show iron sat of 9/ferritin 14/serum iron 38/hemoglobin 12.3/hematocrit 40.3 hep C antibody was negative.  She had  not been placed on an iron supplement.  Review of Systems Pertinent positive and negative review of systems were noted in the above HPI section.  All other review of systems was otherwise negative.  Outpatient Encounter Medications as of 11/28/2020  Medication Sig  . BIOTIN PO Take 1 tablet by mouth daily in the afternoon.  Marland Kitchen CRANBERRY PO Take 1 tablet by mouth daily in the afternoon.  . Ferrous Sulfate (IRON) 325 (65 Fe) MG TABS Take 1 tablet (325 mg total) by mouth in the morning and at bedtime.  . Multiple Vitamin (MULTIVITAMIN) tablet Take 1 tablet by mouth daily.  . [DISCONTINUED] atorvastatin (LIPITOR) 10 MG tablet Take 1 tablet (10 mg total) by mouth daily.   No facility-administered encounter medications on file as of 11/28/2020.   No Known Allergies Patient Active Problem List   Diagnosis Date Noted  . Iron deficiency anemia 10/22/2020  . Hyperlipidemia 10/22/2020  . SBO (small bowel obstruction) (HCC) 04/30/2017  . BV (bacterial vaginosis) 04/30/2017   Social History   Socioeconomic History  . Marital status: Single    Spouse name: Not on file  . Number of children: Not on file  . Years of education: Not on file  . Highest education level: Not on file  Occupational History  . Not on file  Tobacco Use  . Smoking status: Never Smoker  . Smokeless tobacco: Never Used  Vaping Use  . Vaping  Use: Never used  Substance and Sexual Activity  . Alcohol use: Yes    Alcohol/week: 2.0 standard drinks    Types: 2 Standard drinks or equivalent per week  . Drug use: No  . Sexual activity: Yes  Other Topics Concern  . Not on file  Social History Narrative  . Not on file   Social Determinants of Health   Financial Resource Strain: Not on file  Food Insecurity: Not on file  Transportation Needs: Not on file  Physical Activity: Not on file  Stress: Not on file  Social Connections: Not on file  Intimate Partner Violence: Not on file    Ms. Willette's family history includes  Breast cancer in her maternal grandmother; Healthy in her father and mother.      Objective:    Vitals:   11/28/20 0840  BP: 100/76  Pulse: 97    Physical Exam Well-developed well-nourished young African-American female in no acute distress.  Height, Weight, 143 BMI 24.5  HEENT; nontraumatic normocephalic, EOMI, PE R LA, sclera anicteric. Oropharynx; not examined today Neck; supple, no JVD Cardiovascular; regular rate and rhythm with S1-S2, no murmur rub or gallop Pulmonary; Clear bilaterally Abdomen; soft, nontender, nondistended, there is a large scar in the right upper quadrant, no palpable mass or hepatosplenomegaly, bowel sounds are active to hyperactive Rectal; not done today Skin; benign exam, no jaundice rash or appreciable lesions Extremities; no clubbing cyanosis or edema skin warm and dry Neuro/Psych; alert and oriented x4, grossly nonfocal mood and affect appropriate       Assessment & Plan:   #19 26 year old African-American female with history of small bowel obstruction October 2018 for which she was hospitalized.  This was felt secondary to adhesions.  Patient had a pyloromyotomy at 25 year of age.  ER visit January 2022 with acute abdominal pain very reminiscent of the prior obstruction.  Plain films were read as unremarkable.  She says the pain was very similar right-sided radiating into the epigastrium and chest.  She felt poorly for 3 to 4 days, stayed on liquids and symptoms gradually resolved.  This episode was not associated with vomiting diarrhea melena or hematochezia. No recurrence since, however she is worried about this episode, recurrences and what underlying abdominal pathology she may have.  #2 iron deficiency anemia-etiology not clear, possibly on the basis of menstrual periods, no family history of thalassemia, will rule out celiac  Plan; consider CT of the abdomen pelvis with contrast.  Patient would like to have this scheduled in a couple of months  as she is anticipating an insurance change. She is asked to call in the interim should she develop any recurrent symptoms and would do imaging sooner. Start Benefiber 1 to 2 tablespoons daily in a large glass of water to improve bowel habits.  Increase water intake to 60 ounces per day. Start ferrous sulfate 325 mg p.o. twice daily. Check Hemosure, TTG and IgA. Will need follow-up iron studies in 4 months. Patient will be established with Dr. Orvan Falconer, I am happy to see in follow-up.  Leinaala Catanese Oswald Hillock PA-C 12/01/2020   Cc: Rema Fendt, NP

## 2020-12-02 ENCOUNTER — Other Ambulatory Visit: Payer: Self-pay

## 2020-12-02 DIAGNOSIS — D509 Iron deficiency anemia, unspecified: Secondary | ICD-10-CM

## 2020-12-02 NOTE — Progress Notes (Signed)
Reviewed.  Anastashia Westerfeld L. Varsha Knock, MD, MPH  

## 2021-01-21 ENCOUNTER — Ambulatory Visit (HOSPITAL_BASED_OUTPATIENT_CLINIC_OR_DEPARTMENT_OTHER): Payer: Private Health Insurance - Indemnity | Admitting: Obstetrics & Gynecology

## 2022-07-18 IMAGING — US US BREAST*L* LIMITED INC AXILLA
1 series · 4 of 4 positions shown · non-contrast
Comparison: None.

CLINICAL DATA: 24-year-old female presenting for ultrasound of a
region of previously focal breast pain about 6-7 months ago. The
pain lasted for a couple of weeks. She has mild darkened
discoloration of the area which has been unchanged for the past 6-7
months. The pain has resolved. Recently, her grandmother underwent
treatment for breast cancer.

EXAM:
ULTRASOUND OF THE LEFT BREAST

[Series 1: us breast*left* limited inc axilla · 0.06mm/px · 4 of 4 slices shown]
[im 1/4]
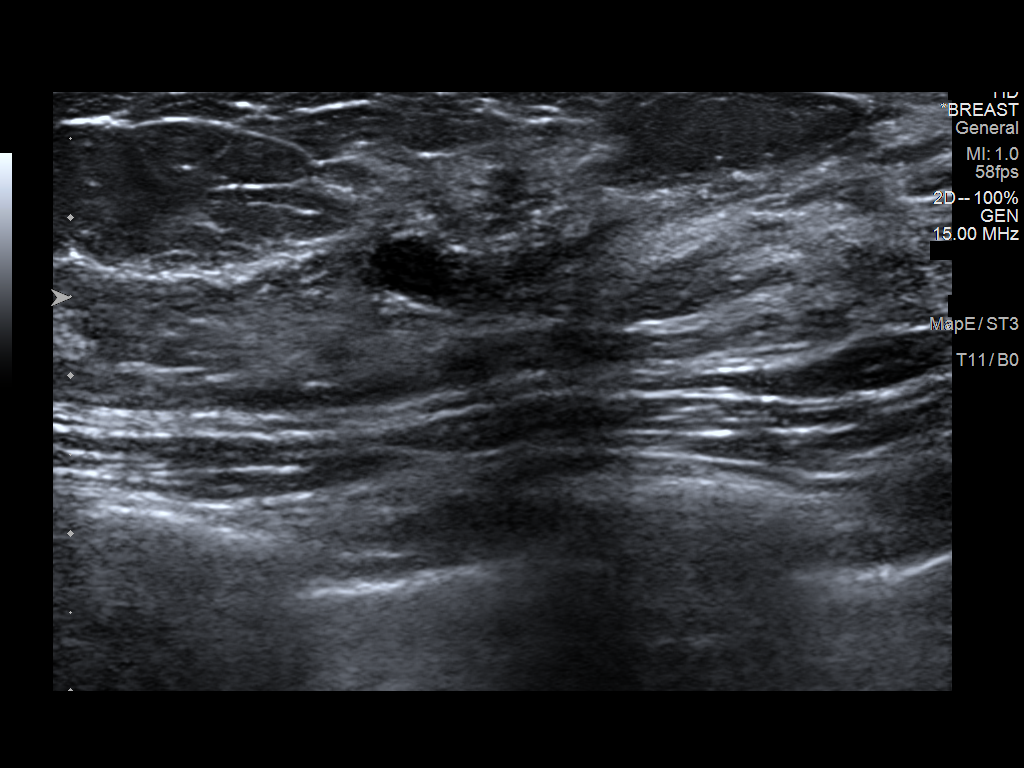
[im 2/4]
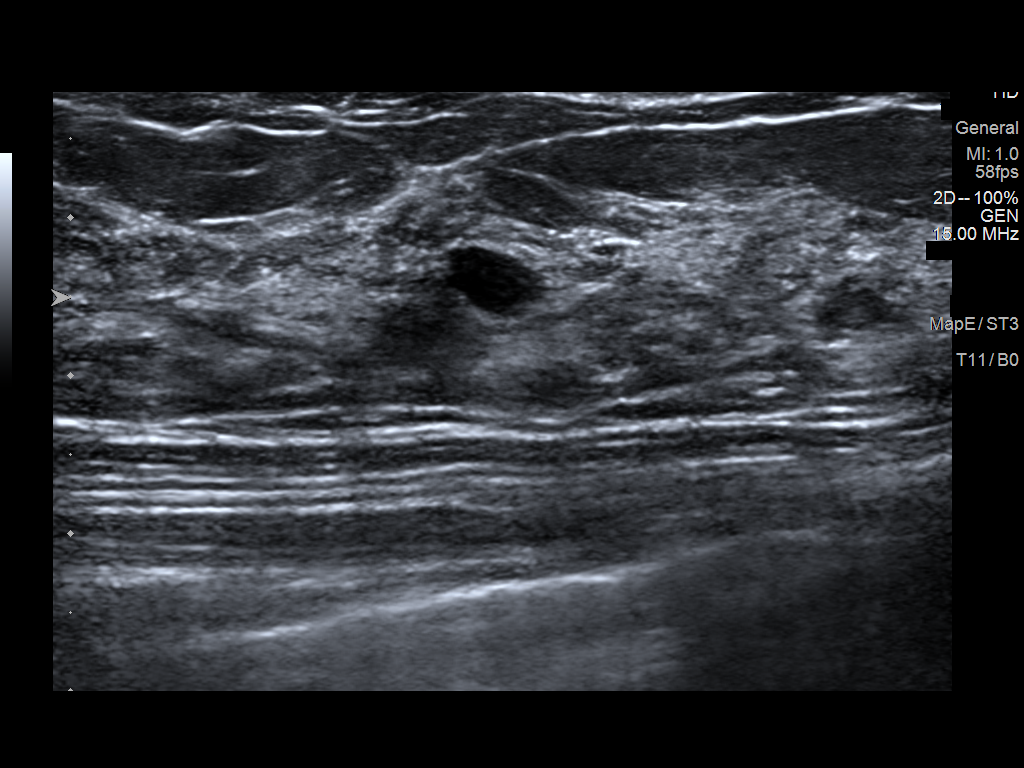
[im 3/4]
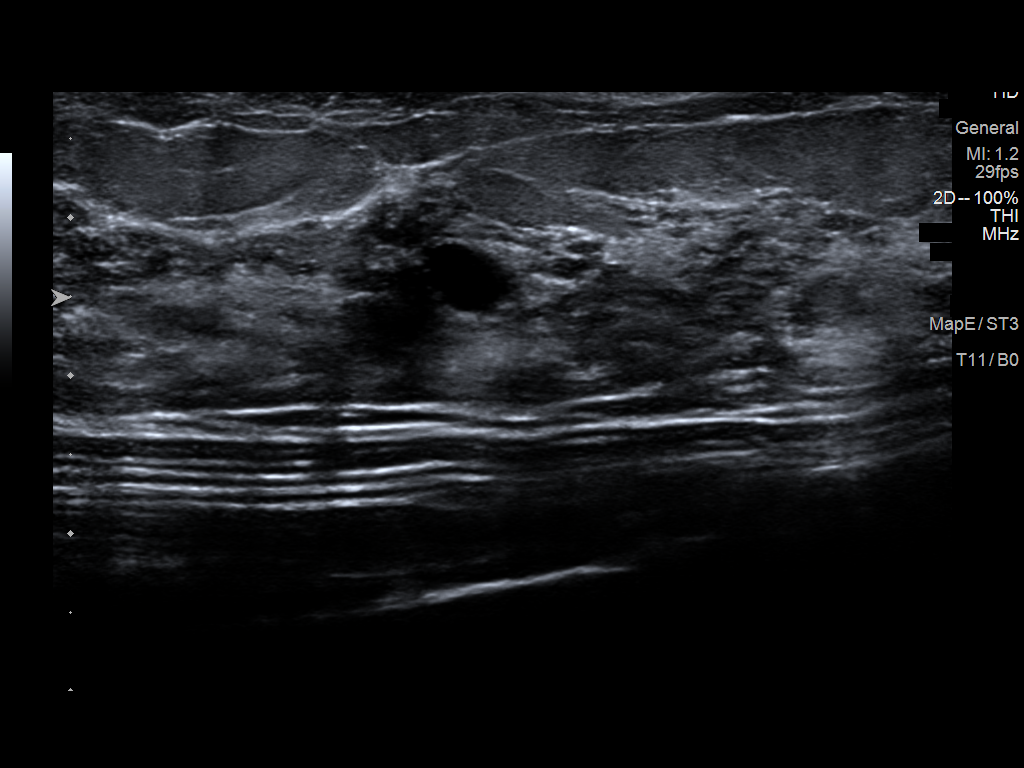
[im 4/4]
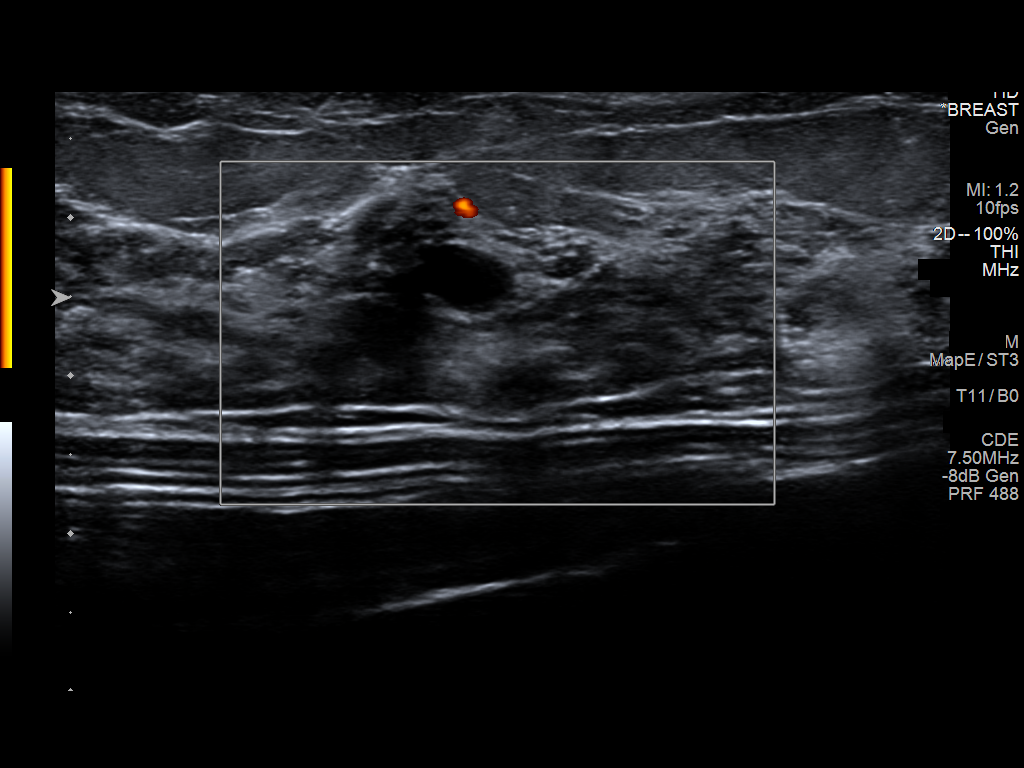

[4 of 4 positions shown; findings below may reference images not displayed]

FINDINGS: On physical exam, a firm palpable ridge of tissue is identified in
the upper inner left breast in the region of the discoloration which
spans approximately 2-3 cm.

Targeted ultrasound is performed, showing an anechoic oval
circumscribed mass at 10 o'clock, 2 cm from the nipple measuring 6
mm.
IMPRESSION: There is a benign cyst in the left breast at 10 o'clock. No
suspicious abnormalities are identified in the region of the skin
discoloration.

RECOMMENDATION:
1. Clinical follow-up recommended for the discolored area of concern
in the left breast. Any further workup should be based on clinical
grounds.

2. Screening mammogram at age 40 unless there are persistent or
intervening clinical concerns. (Code:P3-C-OZN)

I have discussed the findings and recommendations with the patient.
If applicable, a reminder letter will be sent to the patient
regarding the next appointment.

BI-RADS CATEGORY  2: Benign.
